# Patient Record
Sex: Male | Born: 1960 | Race: White | Hispanic: No | Marital: Married | Smoking: Never smoker
Health system: Southern US, Community
[De-identification: ages and names within clinical notes are randomized; demographics above are authoritative.]

## PROBLEM LIST (undated history)

## (undated) DIAGNOSIS — R519 Headache, unspecified: Secondary | ICD-10-CM

## (undated) DIAGNOSIS — M1990 Unspecified osteoarthritis, unspecified site: Secondary | ICD-10-CM

## (undated) DIAGNOSIS — T7840XA Allergy, unspecified, initial encounter: Secondary | ICD-10-CM

## (undated) DIAGNOSIS — F329 Major depressive disorder, single episode, unspecified: Secondary | ICD-10-CM

## (undated) DIAGNOSIS — Z9889 Other specified postprocedural states: Secondary | ICD-10-CM

## (undated) DIAGNOSIS — K219 Gastro-esophageal reflux disease without esophagitis: Secondary | ICD-10-CM

## (undated) DIAGNOSIS — F32A Depression, unspecified: Secondary | ICD-10-CM

## (undated) DIAGNOSIS — R51 Headache: Secondary | ICD-10-CM

## (undated) HISTORY — PX: SPINE SURGERY: SHX786

## (undated) HISTORY — DX: Major depressive disorder, single episode, unspecified: F32.9

## (undated) HISTORY — DX: Allergy, unspecified, initial encounter: T78.40XA

## (undated) HISTORY — DX: Depression, unspecified: F32.A

## (undated) HISTORY — DX: Unspecified osteoarthritis, unspecified site: M19.90

## (undated) HISTORY — DX: Gastro-esophageal reflux disease without esophagitis: K21.9

## (undated) HISTORY — DX: Headache: R51

## (undated) HISTORY — DX: Headache, unspecified: R51.9

## (undated) HISTORY — DX: Other specified postprocedural states: Z98.890

---

## 2000-06-26 HISTORY — PX: SHOULDER SURGERY: SHX246

## 2002-06-24 ENCOUNTER — Ambulatory Visit (HOSPITAL_COMMUNITY): Admission: RE | Admit: 2002-06-24 | Discharge: 2002-06-24 | Payer: Self-pay | Admitting: Family Medicine

## 2002-06-24 ENCOUNTER — Encounter: Payer: Self-pay | Admitting: Family Medicine

## 2002-07-16 ENCOUNTER — Encounter: Payer: Self-pay | Admitting: Neurosurgery

## 2002-07-18 ENCOUNTER — Inpatient Hospital Stay (HOSPITAL_COMMUNITY): Admission: RE | Admit: 2002-07-18 | Discharge: 2002-07-19 | Payer: Self-pay | Admitting: Neurosurgery

## 2002-07-18 ENCOUNTER — Encounter: Payer: Self-pay | Admitting: Neurosurgery

## 2003-06-27 HISTORY — PX: NECK SURGERY: SHX720

## 2004-06-26 HISTORY — PX: FOOT SURGERY: SHX648

## 2006-01-19 ENCOUNTER — Encounter (INDEPENDENT_AMBULATORY_CARE_PROVIDER_SITE_OTHER): Payer: Self-pay | Admitting: *Deleted

## 2006-01-19 ENCOUNTER — Ambulatory Visit (HOSPITAL_COMMUNITY): Admission: RE | Admit: 2006-01-19 | Discharge: 2006-01-20 | Payer: Self-pay | Admitting: Orthopedic Surgery

## 2006-06-26 HISTORY — PX: LUMBAR LAMINECTOMY: SHX95

## 2006-11-26 IMAGING — CR DG LUMBAR SPINE 1V
1 series · 1 of 1 positions shown · non-contrast
Comparison: none

CLINICAL DATA: L4-L5 discectomy

LUMBAR SPINE - PORTABLE ONE VIEW

[view not recorded]
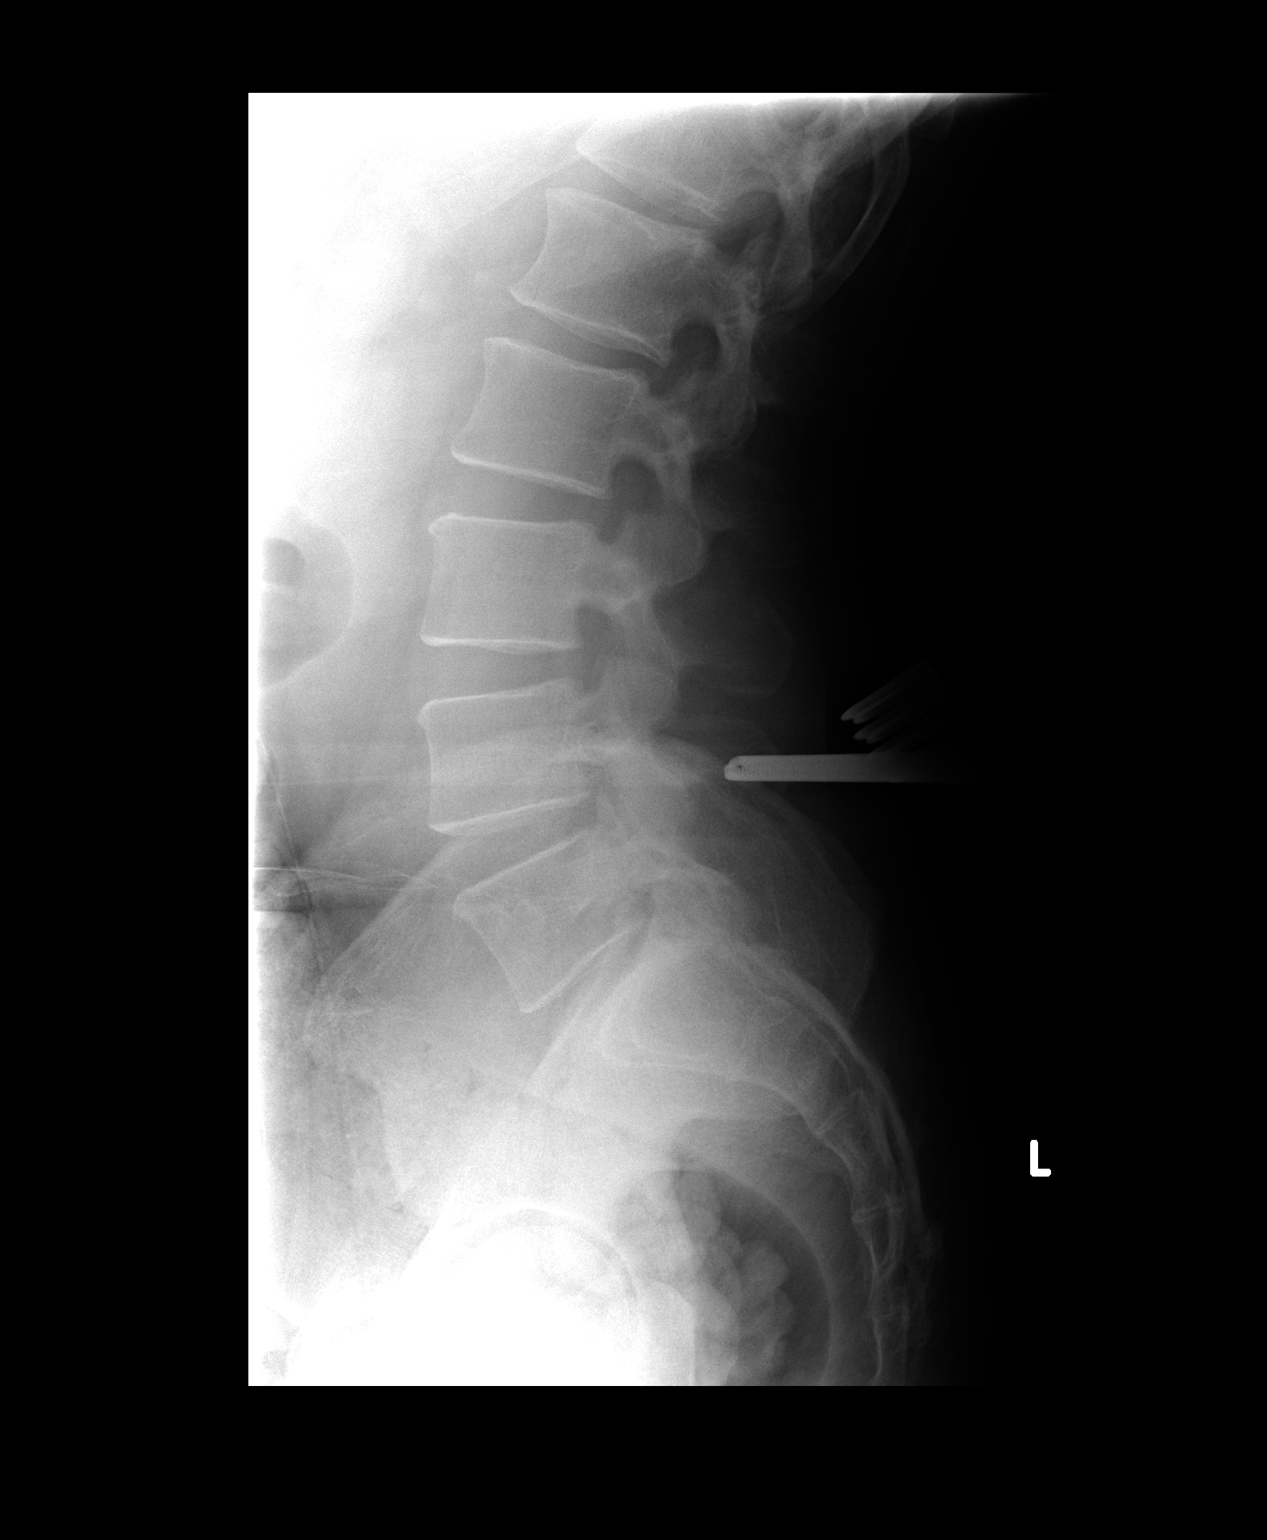

[1 of 1 positions shown; findings below may reference images not displayed]

FINDINGS: Single lateral portable view of the lumbar spine intraoperatively
shows posterior surgical instruments in place with a localizing instrument
directed the L4-L5 level.

IMPRESSION

Localization L4-L5.

## 2009-06-30 ENCOUNTER — Encounter: Payer: Self-pay | Admitting: Family Medicine

## 2009-07-01 ENCOUNTER — Ambulatory Visit: Payer: Self-pay | Admitting: Family Medicine

## 2009-07-01 DIAGNOSIS — M7710 Lateral epicondylitis, unspecified elbow: Secondary | ICD-10-CM | POA: Insufficient documentation

## 2009-07-01 DIAGNOSIS — M549 Dorsalgia, unspecified: Secondary | ICD-10-CM | POA: Insufficient documentation

## 2009-07-01 DIAGNOSIS — M5126 Other intervertebral disc displacement, lumbar region: Secondary | ICD-10-CM

## 2009-07-01 DIAGNOSIS — F341 Dysthymic disorder: Secondary | ICD-10-CM

## 2009-07-14 DIAGNOSIS — J45909 Unspecified asthma, uncomplicated: Secondary | ICD-10-CM | POA: Insufficient documentation

## 2009-07-14 DIAGNOSIS — K219 Gastro-esophageal reflux disease without esophagitis: Secondary | ICD-10-CM

## 2009-09-08 ENCOUNTER — Telehealth: Payer: Self-pay | Admitting: Family Medicine

## 2010-01-25 ENCOUNTER — Ambulatory Visit: Payer: Self-pay | Admitting: Family Medicine

## 2010-03-29 ENCOUNTER — Telehealth (INDEPENDENT_AMBULATORY_CARE_PROVIDER_SITE_OTHER): Payer: Self-pay | Admitting: *Deleted

## 2010-04-21 ENCOUNTER — Encounter: Payer: Self-pay | Admitting: Family Medicine

## 2010-05-05 ENCOUNTER — Encounter: Payer: Self-pay | Admitting: Family Medicine

## 2010-05-10 ENCOUNTER — Telehealth (INDEPENDENT_AMBULATORY_CARE_PROVIDER_SITE_OTHER): Payer: Self-pay | Admitting: *Deleted

## 2010-07-12 ENCOUNTER — Telehealth: Payer: Self-pay | Admitting: Family Medicine

## 2010-07-26 NOTE — Progress Notes (Signed)
Summary: Pt req script for Lexapro to [facility]  Phone Note Call from Patient Call back at [facility] Phone [phone]   Caller: Patient Summary of Call: Pt called and said that the Lexapro samples worked well. Pt has been out of samples for over a week and would like to get a script called in to [facility] [phone].  Initial call taken by: Lucy Antigua,  September 08, 2009 10:43 AM  Follow-up for Phone Call        ok will call in Follow-up by: Pura Spice, RN,  September 08, 2009 12:52 PM  Additional Follow-up for Phone Call Additional follow up Details #1::        I called pt and notified him that script is being called in to [facility] at [facility].  Additional Follow-up by: Lucy Antigua,  September 08, 2009 2:30 PM    New/Updated Medications: LEXAPRO 10 MG TABS (ESCITALOPRAM OXALATE) 1 each day for anxiety AND DEPRESSION Prescriptions: LEXAPRO 10 MG TABS (ESCITALOPRAM OXALATE) 1 each day for anxiety AND DEPRESSION  #30 x 11   Entered by:   Pura Spice, RN   Authorized by:   Judithann Sheen MD   Signed by:   Pura Spice, RN on 09/08/2009   Method used:   Faxed to ...       [facility] (retail)       [address]       [place], [place]  [address]       Ph: [phone]       Fax: [phone]   RxID:   [id number]

## 2010-07-26 NOTE — Progress Notes (Signed)
Summary: Disability FYI  Phone Note Outgoing Call   Summary of Call: Received paperwork from Disability Determination Services. Put paperwok on Dr Laurita Quint desk for him to fill out when he returns. Discussed with Dr Abner Greenspan. Initial call taken by: Josph Macho RMA,  May 10, 2010 12:06 PM

## 2010-07-26 NOTE — Letter (Signed)
Summary: [facility]  [facility]   Imported By: Georgian Co 04/21/2010 09:49:45  _____________________________________________________________________  External Attachment:    Type:   Image     Comment:   External Document

## 2010-07-26 NOTE — Assessment & Plan Note (Signed)
Summary: NEW PT EST // RS (PT UDRSTNDS DR HAS TO OK)   Vital Signs:  Patient profile:   50 year old male Weight:      200 pounds Temp:     98.6 degrees F BP sitting:   160 / 110  (left arm)  Vitals Entered By: Pura Spice, RN (July 01, 2009 2:55 PM) CC: to establish    History of Present Illness: this 50 year old white male who was a resident of [place] currently now moved to live in [place] desires to be a patient under my care first urge with him was 2 years ago when I referred him to an orthopedist for evaluation of back which resulted in a lumbar laminectomy for herniated disc. He improved initially but then began to have recurrence of his pain which is increased in severity over the past 6-9 months. He had surgery by [facility] and to give orthopedic group he His pain is present over the lumbar spine and radiating down the right leg to his foot. The patient has lost his insurance and were unable to do an MRI at this time and must treat him as with NSAIDs and analgesics. 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 And in fact terminally ill secondary to colon cancer  Allergies (verified): No Known Drug Allergies  Past History:  Past Medical History: lumbar laminectomy  Past Surgical History: lumbar laminectomy 2008  Review of Systems      See HPI General:  See HPI; Complains of malaise and sleep disorder; denies chills, fatigue, fever, loss of appetite, sweats, weakness, and weight loss. Eyes:  Denies blurring, discharge, double vision, eye irritation, eye pain, halos, itching, light sensitivity, red eye, vision  loss-1 eye, and vision loss-both eyes. ENT:  Denies decreased hearing, difficulty swallowing, ear discharge, earache, hoarseness, nasal congestion, nosebleeds, postnasal drainage, ringing in ears, sinus pressure, and sore throat. CV:  Denies bluish discoloration of lips or nails, chest pain or discomfort, difficulty breathing at night, difficulty breathing while lying down, fainting, fatigue, leg cramps with exertion, lightheadness, near fainting, palpitations, shortness of breath with exertion, swelling of feet, swelling of hands, and weight gain. Resp:  Complains of wheezing; long-term history of asthma has been under the care of Dr. Dan Europe on cervical. GI:  Denies abdominal pain, bloody stools, change in bowel habits, constipation, dark tarry stools, diarrhea, excessive appetite, gas, hemorrhoids, indigestion, loss of appetite, nausea, vomiting, vomiting blood, and yellowish skin color. GU:  Denies decreased libido, discharge, dysuria, erectile dysfunction, genital sores, hematuria, incontinence, nocturia, urinary frequency, and urinary hesitancy. MS:  See HPI; Complains of joint pain and low back pain. Derm:  See HPI; Denies changes in color of skin, changes in nail beds, dryness, excessive perspiration, flushing, hair loss, insect bite(s), itching, lesion(s), poor wound healing, and rash. Neuro:  Denies brief paralysis, difficulty with concentration, disturbances in coordination, falling down, headaches, inability to speak, memory loss, numbness, poor balance, seizures, sensation of room spinning, tingling, tremors, visual disturbances, and weakness. Psych:  Complains of depression.  Physical Exam  General:  Well-developed,well-nourished,in no  acute distress; alert,appropriate and cooperative throughout examination Head:  Normocephalic and atraumatic without obvious abnormalities. No apparent alopecia or balding. Eyes:  No corneal or conjunctival inflammation noted. EOMI. Perrla.  Funduscopic exam benign, without hemorrhages, exudates or papilledema. Vision grossly normal. Ears:  External ear exam shows no significant lesions or deformities.  Otoscopic examination reveals clear canals, tympanic membranes are intact bilaterally without bulging, retraction, inflammation or discharge. Hearing is grossly normal bilaterally. Nose:  External nasal examination shows no deformity or inflammation. Nasal mucosa are pink and moist without lesions or exudates. Mouth:  Oral mucosa and oropharynx without lesions or exudates.  Teeth in good repair. Neck:  No deformities, masses, or tenderness noted. Lungs:  Normal respiratory effort, chest expands symmetrically. Lungs are clear to auscultation, no crackles or wheezes. Heart:  Normal rate and regular rhythm. S1 and S2 normal without gallop, murmur, click, rub or other extra sounds. Abdomen:  opiates otherwise negative Rectal:  not examined Genitalia:  none exam Msk:  tenderness LS spine especially a right SI joint Limitation straight leg raising 60 on the right Tenderness over lateral epicondyle of the right elbow with some swelling and severe tenderness on touch Pulses:  R and L carotid,radial,femoral,dorsalis pedis and posterior tibial pulses are full and equal bilaterally Extremities:  No clubbing, cyanosis, edema, or deformity noted with normal full range of motion of all joints.     Impression & Recommendations:  Problem # 1:  ANXIETY DEPRESSION (ICD-300.4) Assessment New Lexapro 10 mg q. day  Problem # 2:  LATERAL EPICONDYLITIS, RIGHT (ICD-726.32) Assessment: New baclofen acts and 5 mg b.i.d. 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 1 q4h as  needed for pain, not over  4 per day, refillon schedule    Diclofenac Sodium 75 Mg Tbec (Diclofenac sodium) .Marland Kitchen... 1 two times a day for inflammation  Orders: Depo- Medrol 80mg  (J1040) Admin of Therapeutic Inj  intramuscular or subcutaneous ([address])  Problem # 5:  BACK PAIN, CHRONIC (ICD-724.5)  Complete Medication List: 1)  Symbicort 80-4.5 Mcg/act Aero (Budesonide-formoterol fumarate) 2)  Hydrocodone-acetaminophen 10-660 Mg Tabs (Hydrocodone-acetaminophen) .Marland Kitchen.. 1 q4h as needed for pain, not over  4 per day, refillon schedule 3)  Diclofenac Sodium 75 Mg Tbec (Diclofenac sodium) .Marland Kitchen.. 1 two times a day for inflammation 4)  Amitriptyline Hcl 25 Mg Tabs (Amitriptyline hcl) .... 2 hs for relief with pain and depression 5)  Lexapro 10 Mg Tabs (Escitalopram oxalate) .Marland Kitchen.. 1 each day for anxiety and depression  Patient Instructions: 1)  lEXAPRO 10 MG once daily FOR ANXIETY AND DEPRESSION 2)  amitryptilene 50 mg hs 1 02 2 25mg  at bedtime 3)  diclofenac 75 mg two times a day 4)  for inflammation 5)  hydrocodone  apa 1 qid as needed pain 6)  Depomedrol 160  mg Im 7)  call in 7-10 days or earlier if needed Prescriptions: AMITRIPTYLINE HCL 25 MG TABS (AMITRIPTYLINE HCL) 2 hs for relief with pain and depression  #100 x 5   Entered and Authorized by:   Judithann Sheen MD   Signed by:   Judithann Sheen MD on 07/01/2009   Method used:   Print then Give to Patient   RxID:   [id number] DICLOFENAC SODIUM 75 MG TBEC (DICLOFENAC SODIUM)  1 two times a day for inflammation  #60 x 11   Entered and Authorized by:   Judithann Sheen MD   Signed by:   Judithann Sheen MD on 07/01/2009   Method used:   Print then Give to Patient   RxID:   [id number] HYDROCODONE-ACETAMINOPHEN 10-660 MG TABS (HYDROCODONE-ACETAMINOPHEN) 1 q4h as needed for pain, not over  4 per day, refillon schedule  #120 x 5   Entered and Authorized by:   Judithann Sheen MD   Signed by:   Judithann Sheen  MD on 07/01/2009   Method used:   Print then Give to Patient   RxID:   [id number]    Medication Administration  Injection # 1:    Medication: Depo- Medrol 80mg     Diagnosis: BACK PAIN, CHRONIC (ICD-724.5)    Route: IM    Site: RUOQ gluteus    Exp Date: 03/2010    Lot #: [id number]    Mfr: teva    Comments: 160 mg given     Patient tolerated injection without complications    Given by: Pura Spice, RN (July 01, 2009 4:25 PM)  Orders Added: 1)  Depo- Medrol 80mg  [J1040] 2)  Admin of Therapeutic Inj  intramuscular or subcutaneous [96372] 3)  Est. Patient Level IV [28413]

## 2010-07-26 NOTE — Letter (Signed)
Summary: Telephone Triage Form Regarding New Patient Appt.  Telephone Triage Form Regarding New Patient Appt.   Imported By: Lanelle Bal 07/19/2009 08:30:30  _____________________________________________________________________  External Attachment:    Type:   Image     Comment:   External Document  Appended Document: Telephone Triage Form Regarding New Patient Appt. OK for new patient

## 2010-07-26 NOTE — Letter (Signed)
Summary: DISABILITY DETERMINATION  DISABILITY DETERMINATION   Imported By: Georgian Co 05/05/2010 08:10:22  _____________________________________________________________________  External Attachment:    Type:   Image     Comment:   External Document

## 2010-07-26 NOTE — Progress Notes (Signed)
Summary: Pt called [id number] info from dissability case sent to attorney  Phone Note Call from Patient Call back at Home Phone [phone]   Caller: Patient Summary of Call: Pt called and said that he needs to get info re: his dissability case, sent to his attorney. Pt said that Dr. Scotty Court has been handling this for the pt. Pls call asap to discuss.  Initial call taken by: Lucy Antigua,  March 29, 2010 9:16 AM  Follow-up for Phone Call        If he wants my input he has to come in Follow-up by: Danise Edge MD,  March 29, 2010 2:26 PM  Additional Follow-up for Phone Call Additional follow up Details #1::        Patient informed and states he will call back to schedule appt Additional Follow-up by: Josph Macho RMA,  March 29, 2010 2:40 PM

## 2010-07-26 NOTE — Assessment & Plan Note (Signed)
Summary: fup//ccm   Vital Signs:  Patient profile:   50 year old male Weight:      197 pounds O2 Sat:      98 % Temp:     98.6 degrees F Pulse rate:   96 / minute Pulse rhythm:   regular BP sitting:   140 / 100  (left arm) Cuff size:   large  Vitals Entered By: Pura Spice, RN (January 25, 2010 4:15 PM) CC: refills    History of Present Illness: This 50 year old white male with chronic back pain has had previous knee a UDS to move and continues to have pain. 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 He needs analgesics in order to function he is now in the process of applying for disability and is to see the disability doctor within the next 2 weeks hydrocodone have not been adequate to control his pain he does not have the money of financial support to go to a pain clinic so I'll attempt to help him with his pain at this time and later referred him to a pain clinic we have had her long talks and discussed the controlled substance contract However he still does have amitriptyline as well as give him oxycodone for 3 months or upon then he should have seen a physician and then Possibly with the help with his father and sister may see his neurosurgeon patient also has a problem with asthma, uses Azmacort and Advair in the past           Allergies (verified): No Known Drug Allergies  Review of Systems      See HPI  The patient denies anorexia, fever, weight loss, weight gain, vision loss, decreased hearing, hoarseness, chest pain, syncope, dyspnea on exertion, peripheral edema, prolonged cough, headaches, hemoptysis, abdominal pain, melena, hematochezia, severe indigestion/heartburn, hematuria, incontinence, genital sores, muscle weakness, suspicious skin lesions, transient blindness, difficulty walking, depression, unusual weight change, abnormal bleeding, enlarged lymph nodes, angioedema, breast masses, and testicular masses.    Physical  Exam  General:  Well-developed,well-nourished,in no acute distress; alert,appropriate and cooperative throughout examinationoverweight-appearing.   Lungs:  bronchial breathing with expiratory wheezes on deep inspiration no dullness to percussion no rales Heart:  Normal rate and regular rhythm. S1 and S2 normal without gallop, murmur, click, rub or other extra sounds. Msk:  limitation flexion of the lumbosacral spine as well as a bilateral lumbar muscle spasm Reflexes hyporeflexic Pulses:  R and L carotid,radial,femoral,dorsalis pedis and posterior tibial pulses are full and equal bilaterally Extremities:  No clubbing, cyanosis, edema, or deformity noted with normal full range of motion of all joints.     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 1 q4h as needed for pain, not over  4 per day, refillon schedule His updated medication list for this problem includes:    Diclofenac Sodium 75  Mg Tbec (Diclofenac sodium) .Marland Kitchen... 1 two times a day for inflammation    Oxycodone-acetaminophen 10-650 Mg Tabs (Oxycodone-acetaminophen) .Marland Kitchen... 1 ta b q4-6 h as needed pain, not over 4 per ayd    Oxycodone-acetaminophen 10-650 Mg Tabs (Oxycodone-acetaminophen) .Marland Kitchen... 1 q4h as needed pain not over 4 per day,  fill 0n 3 sept    Oxycodone-acetaminophen 10-650 Mg Tabs (Oxycodone-acetaminophen) .Marland Kitchen... 1 q4-6 h as needed pain, not over 4 per day, fill 3 oct  Orders: Depo- Medrol 80mg  (J1040) Depo- Medrol 80mg  (J1040) Admin of Therapeutic Inj  intramuscular or subcutaneous (60454)  Complete Medication List: 1)  Symbicort 80-4.5 Mcg/act Aero (Budesonide-formoterol fumarate) 2)  Diclofenac Sodium 75 Mg Tbec (Diclofenac sodium) .Marland Kitchen.. 1 two times a day for inflammation 3)  Amitriptyline Hcl 25 Mg Tabs (Amitriptyline hcl) .... 2 hs for relief with pain and depression 4)  Lexapro 10 Mg Tabs (Escitalopram oxalate) .Marland Kitchen.. 1 each day for anxiety and depression 5)  Oxycodone-acetaminophen 10-650 Mg Tabs (Oxycodone-acetaminophen) .Marland Kitchen.. 1 ta b q4-6 h as needed pain, not over 4 per ayd 6)  Oxycodone-acetaminophen 10-650 Mg Tabs (Oxycodone-acetaminophen) .Marland Kitchen.. 1 q4h as needed pain not over 4 per day, fill 0n 3 sept 7)  Oxycodone-acetaminophen 10-650 Mg Tabs (Oxycodone-acetaminophen) .Marland Kitchen.. 1 q4-6 h as needed pain, not over 4 per day, fill 3 oct  Patient Instructions: 1)  [facility] records to medical disability 2)  Take medications as prescribed 3)  Keep your Pulmo-Aide physician for the evaluation of your disability Prescriptions: OXYCODONE-ACETAMINOPHEN 10-650 MG TABS (OXYCODONE-ACETAMINOPHEN) 1 q4-6 h as needed pain, not over 4 per day, fill 3 Oct  #120 x 0   Entered and Authorized by:   Judithann Sheen MD   Signed by:   Judithann Sheen MD on 01/25/2010   Method used:   Print then Give to Patient   RxID:   [id number] OXYCODONE-ACETAMINOPHEN 10-650 MG TABS (OXYCODONE-ACETAMINOPHEN) 1 q4h as needed pain not over 4 per day, fill 0n 3 Sept  #120 x 0   Entered and Authorized by:   Judithann Sheen MD   Signed by:   Judithann Sheen MD on 01/25/2010   Method used:   Print then Give to Patient   RxID:   [id number] OXYCODONE-ACETAMINOPHEN 10-650 MG TABS (OXYCODONE-ACETAMINOPHEN) 1 ta b q4-6 h as  needed pain, not over 4 per ayd  #120 x 0   Entered and Authorized by:   Judithann Sheen MD   Signed by:   Judithann Sheen MD on 01/25/2010   Method used:   Print then Give to Patient   RxID:   [id number] AMITRIPTYLINE HCL 25 MG TABS (AMITRIPTYLINE HCL) 2 hs for relief with pain and depression  #100 x 4   Entered and Authorized by:   Judithann Sheen MD   Signed by:   Judithann Sheen MD on 01/25/2010   Method used:   Electronically to        [facility] (retail)       [address]       [place], [place]  [address]       Ph: [phone]       Fax: [phone]   RxID:   [id number] HYDROCODONE-ACETAMINOPHEN 10-660 MG TABS (HYDROCODONE-ACETAMINOPHEN) 1 q4h as needed for pain, not over  4 per day, refillon schedule  #120 x 5   Entered and Authorized by:   Judithann Sheen  MD   Signed by:   Judithann Sheen MD on 01/25/2010   Method used:   Print then Give to Patient   RxID:   [id number]    Medication Administration  Injection # 1:    Medication: Depo- Medrol 80mg     Diagnosis: BACK PAIN, CHRONIC (ICD-724.5)    Route: IM    Site: RUOQ gluteus    Exp Date: 09/2012    Lot #: [id number]    Mfr: Pharm[employer]    Patient tolerated injection without complications    Given by: Pura Spice, RN (January 25, 2010 5:14 PM)  Injection # 2:    Medication: Depo- Medrol 80mg     Diagnosis: BACK PAIN, CHRONIC (ICD-724.5)    Route: IM    Site: RUOQ gluteus    Exp Date: 09/2012    Lot #: [id number]    Mfr: [employer]    Patient tolerated injection without complications    Given by: Pura Spice, RN (January 25, 2010 5:15 PM)  Orders Added: 1)  Depo- Medrol 80mg  [J1040] 2)  Depo- Medrol 80mg  [J1040] 3)  Admin of Therapeutic Inj  intramuscular or subcutaneous [96372] 4)  Est. Patient Level III [14782]

## 2010-07-28 NOTE — Progress Notes (Signed)
Summary: Pt req refill of Hydrocodone and Amitriptyline  Phone Note Refill Request Call back at [phone] cell   Refills Requested: Medication #1:  Hydrocodone 10-650mg    Dosage confirmed as above?Dosage Confirmed  Medication #2:  AMITRIPTYLINE HCL 25 MG TABS 2 hs for relief with pain and depression   Dosage confirmed as above?Dosage Confirmed Pls call in to [facility] in [facility].   Pharmacy said that they could not fill meds until 08/10/10 and pt doesnt know why. Pls call in.    Method Requested: Telephone to [facility] in [facility] Initial call taken by: Lucy Antigua,  July 12, 2010 10:47 AM  Follow-up for Phone Call        ok to refill 1x early per Dr Scotty Court, l/m on pharmacy voicemail Follow-up by: Alfred Levins, CMA,  July 13, 2010 3:22 PM

## 2010-08-18 ENCOUNTER — Ambulatory Visit (INDEPENDENT_AMBULATORY_CARE_PROVIDER_SITE_OTHER): Payer: Self-pay | Admitting: Family Medicine

## 2010-08-18 ENCOUNTER — Encounter: Payer: Self-pay | Admitting: Family Medicine

## 2010-08-18 VITALS — BP 150/100 | HR 112 | Temp 98.9°F | Wt 206.0 lb

## 2010-08-18 DIAGNOSIS — M5020 Other cervical disc displacement, unspecified cervical region: Secondary | ICD-10-CM

## 2010-08-18 DIAGNOSIS — M549 Dorsalgia, unspecified: Secondary | ICD-10-CM

## 2010-08-18 DIAGNOSIS — G8929 Other chronic pain: Secondary | ICD-10-CM

## 2010-08-18 DIAGNOSIS — M1990 Unspecified osteoarthritis, unspecified site: Secondary | ICD-10-CM

## 2010-08-18 DIAGNOSIS — K219 Gastro-esophageal reflux disease without esophagitis: Secondary | ICD-10-CM

## 2010-08-18 DIAGNOSIS — M129 Arthropathy, unspecified: Secondary | ICD-10-CM

## 2010-08-18 MED ORDER — AMITRIPTYLINE HCL 100 MG PO TABS: 100.0000 mg | ORAL_TABLET | Freq: Every day | ORAL | Status: DC

## 2010-08-18 MED ORDER — OXYCODONE-ACETAMINOPHEN 10-650 MG PO TABS: 1.0000 | ORAL_TABLET | Freq: Four times a day (QID) | ORAL | Status: AC | PRN

## 2010-08-18 MED ORDER — OXYCODONE-ACETAMINOPHEN 10-650 MG PO TABS: 1.0000 | ORAL_TABLET | ORAL | Status: DC

## 2010-08-18 MED ORDER — OXYCODONE-ACETAMINOPHEN 10-650 MG PO TABS: 1.0000 | ORAL_TABLET | Freq: Four times a day (QID) | ORAL | Status: DC | PRN

## 2010-08-18 MED ORDER — DICLOFENAC SODIUM 75 MG PO TBEC: 75.0000 mg | DELAYED_RELEASE_TABLET | Freq: Two times a day (BID) | ORAL | Status: DC

## 2010-08-19 DIAGNOSIS — M5020 Other cervical disc displacement, unspecified cervical region: Secondary | ICD-10-CM | POA: Insufficient documentation

## 2010-08-19 NOTE — Progress Notes (Signed)
Subjective:    Patient ID: Shawn Nelson, male    DOB: [birth year], 50 y.o.   MRN: [id number] Chest 50 year old white male who has chronic back pain and has had same force some time her requiring him to actively do this him almost do part-time work because of the severity of the pain. He has multiple medical problems he has had surgery for herniated disc the lumbosacral spine and even after was never relieved completely with pain and has had 2 or lateral analgesics as hydrocodone at this point in time hydrocodone we will leave his pain if he is inactive 5 or if he is active working it gives him terribly. After working 4 hours a pain in his back is very severe. In addition to the pain in the lumbosacral spine he also has pain in her cervical spine which which is present and radiating into the left arm and hand he needs an MRI of the cervical spine but is unable to obtain this since he does not have any insurance.Marland Kitchen He is taking any NSAID , diclofenac and hydrocodone with Tylenol here it at night he wakes up with severe pain in his neck radiation into his arm We have discussed the fact that we'll try utilizing oxycodone for better relief Another problem he has varicose veins which are very obvious in the right leg and are tender and painful we discussed the fact that if he is fortunate enough to get his disability that we will be able to fully evaluate his problems and get adequate treatment there is obvious he is a very miserable uncomfortable person with pain in his quality of life due to this is not very good. His desire to work today or but isn't been able to work long enough hours is not possible due to the pain His problem with GERD is controlled with omeprazole HPI    Review of Systems  Constitutional: Positive for fever, activity change and fatigue. Negative for diaphoresis and appetite change.  HENT: Negative.   Eyes: Negative.   Respiratory: Positive for shortness of breath and wheezing.      Patient has problem with episodic wheezing or asthma and he goes to the [facility] Department for inhalation treatment  Cardiovascular: Negative.   Gastrointestinal:       History of GERD relieved with omeprazole  Genitourinary: Negative.   Musculoskeletal: Positive for back pain, joint swelling and arthralgias.  Skin: Negative.   Neurological: Negative.   Hematological: Negative.   Psychiatric/Behavioral:       Patient is very anxious and depressed over his multiple medical problems which cannot be resolved at this time       Objective:   Physical Exam HEENT no unremarkable findings thyroid level heart examination normal lungs minimal rhonchi bilaterally with wheezes on expiration abdomen obese otherwise negative examination of the back reveals marked tenderness over the right SI joint as well as examination of the neck there is tenderness over the cervical spine on the left side with radiation of pain Examination of the right leg revealed large varicosities on the lateral right thigh and lower leg,tender to palpation      Assessment & Plan:  Patient has very bad problem with chronic back pain as well as cervical pain which is very much aggravated with activity and work .only relieved with analgesicswith analgesics and I plan to change from hydrocodone to Percocet..when he is declared disabled and has Medicare will plan on radiological studies to seek full course of treatment  for his problem there to continue his regular medications as prescribed

## 2010-08-19 NOTE — Patient Instructions (Signed)
T2 change hydrocodone to oxycodone 10 650 q.4 h. P.r.n. Pain not over 4 for day Avoid as much as possible activities that aggravate both the back and neck Continue diclofenac, cold packs followed by heat treatment Call p.r.n.

## 2010-09-22 ENCOUNTER — Ambulatory Visit: Payer: Self-pay | Admitting: Family Medicine

## 2010-11-11 NOTE — Op Note (Signed)
NAMEASHBY, MOSKAL             ACCOUNT NO.:  [id number]   MEDICAL RECORD NO.:  [id number]          PATIENT TYPE:  AMB   LOCATION:  SDS                          FACILITY:  [facility]   PHYSICIAN:  Nelda Severe, MD      DATE OF BIRTH:  02/11/1961   DATE OF PROCEDURE:  01/19/2006  DATE OF DISCHARGE:                                 OPERATIVE REPORT   SURGEON:  Dr. Nelda Severe.   ASSISTANT:  OR staff.   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   POSTOPERATIVE DIAGNOSIS:  Left L4-5 herniated nucleus pulposus lumbar disc.   OPERATIVE PROCEDURE:  L4-5 left-sided laminotomy, medial facetectomy, disc  excision.   OPERATIVE NOTE:  The patient was placed under general endotracheal  anesthesia.  Vancomycin had been administered intravenously for antibiotic  prophylaxis.  He was positioned prone on the [place] frame.  The upper  extremities were padded from axilla to hands with foam.  Care was taken to  avoid hyperabduction and flexion of the shoulders and to avoid hyperflexion  of the elbows.  There was no pressure on the cubital tunnels.  The thighs,  knees and shins and feet were supported on pillows.   Hair was clipped from the lumbar area.  The proposed midline incision was  marked with a skin marker.  The skin was prepped with DuraPrep and draped in  square fashion.  The drapes were secured with Ioban.   A midline incision was made was at what was perceived to be the L4-5 level.  Paraspinal muscles were mobilized to the left side.  A cross-table lateral  radiograph was taken with a Kocher clamp on what proved to be the spinous  process of L4.  The paraspinal muscle was further mobilized to expose the L4-  5 interlaminar space.  A Taylor retractor was placed lateral to the L4-5  apophyseal  joint.  The interlaminar space was cleared of soft tissue.  A  high-speed bur was then used to perform a medial facetectomy and proximal  laminotomy into the  lamina of L4.  A 3.0 Carlen curet was then used to  separate ligamentum flavum and joint capsule from the undersurface of the  superior articular process of L5 and from the lamina of L5.  The laminotomy  was then extended laterally so that a medial facetectomy was completed and  the small portion of the proximal lamina of L5 removed.  Ligamentum flavum  was mobilized from its lateral attachment in the lateral recess.  It was  retracted medially.  Actually no ligamentum flavum was actually excised.   The L5 nerve root was visualized and mobilized medially.  A few epidural  veins were bipolar coagulated.  The disc was contained and appeared as the  MRI image suggested it would.  The outer fibers of the annulus were  perforated with a Agricultural engineer.  We then used a combination of  Epstein curets, Carlen curet and pituitary rongeurs to further enucleate the  disc.  The floor of the spinal canal was palpated with a ball-tip nerve hook  and  the L5 nerve root appeared well decompressed.  The ball-tipped nerve  hook was also used to palpate the L4-5 neural foramen and there was no  suggestion of any further disc impingement laterally upon the L4 nerve root.   The wound was thoroughly irrigated with saline.  The disc space was  irrigated with saline.   Earlier, while waiting for x-ray, we had injected the paraspinal muscles and  lumbar fascia with a mixture of 0.25% Marcaine with epinephrine and 1%  lidocaine with epinephrine.   The wound was then closed in layers using interrupted and continuous #1  Vicryl in the fascia, interrupted 2-0 Vicryl on the subcutaneous tissue and  3-0 undyed Vicryl placed in subcuticular continuous fashion for the skin.  Skin edges were reinforced with Steri-Strips.  A nonadherent antibiotic  ointment dressing was applied and secured with OpSite.   Blood loss estimated at 50 mL.  There were no intraoperative complications.  At the time of dictation, the  patient was not awakened sufficiently to  perform a neurologic examination.      Nelda Severe, MD  Electronically Signed     MT/MEDQ  D:  01/19/2006  T:  01/19/2006  Job:  [id number]

## 2010-11-11 NOTE — Discharge Summary (Signed)
Shawn Nelson, Shawn Nelson             ACCOUNT NO.:  [id number]   MEDICAL RECORD NO.:  [id number]          PATIENT TYPE:  OIB   LOCATION:  5004                         FACILITY:  [facility]   PHYSICIAN:  Nelda Severe, MD      DATE OF BIRTH:  [birth year]   DATE OF ADMISSION:  01/19/2006  DATE OF DISCHARGE:  01/20/2006                                 DISCHARGE SUMMARY   DATE OF ADMISSION:  01/19/2006   DATE OF DISCHARGE:  01/20/2006   HOSPITAL COURSE:  This 50 year old male was admitted for management of  chronic progressive left sciatic pain secondary to an L4-5 disc herniation.  On the day of admission he was taken to the operating room where a left L4-5  laminotomy, medial facetectomy and disc excision were carried out.  The L5  nerve root well decompressed at the termination of surgery.  The L4 nerve  root did not appear compressed in the neural foramina at L4-5.   Postoperatively his left leg pain was resolved.  He is complaining of back  pain this morning after the local anesthetic injected into the paraspinal  fascia had worn off.   At this time he is being discharged with instruction to avoid bending and  lifting, avoid continuous sitting for more than 20 to 30 minutes for the  next week to two weeks, to make and appointment to see me in the office in  10 to 12 days time to inform me if there is any persistent drainage or  fever.   He is being given a prescription for Flexeril 10 mg PO q6h prn for spasms  #25 tablets, which he may fill if his supply at home runs out.  He has also  been given a prescription for Norco 10 mg one to two q6h prn maximum 6 per  day # 60 tablets.   DIAGNOSIS:  L4-5 disc herniation left side.   CONDITION ON DISCHARGE:  Improved, ambulatory, voiding without difficulty.   Follow up arrangements and restrictions-see above.      Nelda Severe, MD  Electronically Signed     MT/MEDQ  D:  01/20/2006  T:  01/21/2006  Job:  [id number]

## 2010-11-11 NOTE — Op Note (Signed)
Shawn Nelson, Shawn Nelson                         ACCOUNT NO.:  [id number]   MEDICAL RECORD NO.:  [id number]                   PATIENT TYPE:  INP   LOCATION:  3008                                 FACILITY:  [facility]   PHYSICIAN:  Danae Orleans. Venetia Maxon, M.D.               DATE OF BIRTH:  1960/12/18   DATE OF PROCEDURE:  07/18/2002  DATE OF DISCHARGE:                                 OPERATIVE REPORT   PREOPERATIVE DIAGNOSIS:  Herniated disk, spondylosis, degenerative disk  disease, cervical radiculopathy, C5-6 and C6-7 levels.   POSTOPERATIVE DIAGNOSIS:  Herniated disk, spondylosis, degenerative disk  disease, cervical radiculopathy, C5-6 and C6-7 levels.   PROCEDURE:  Anterior cervical decompression and fusion, C5-6 and C6-7 levels  with allograft bone graft and anterior cervical plate.   SURGEON:  Danae Orleans. Venetia Maxon, M.D.   ASSISTANT:  Payton Doughty, M.D.   ANESTHESIA:  General endotracheal anesthesia.   ESTIMATED BLOOD LOSS:  500 mL.   COMPLICATIONS:  None.   DISPOSITION:  To recovery.   INDICATIONS:  The patient is a 50 year old man with asthma with a  significant left cervical radiculopathy at C5-6 and C6-7 levels.  It  is  elected to take him to surgery for anterior cervical diskectomy and fusion.   DESCRIPTION OF PROCEDURE:  The patient was brought to the operating room.  Following the satisfactory and uncomplicated induction of general  endotracheal anesthesia and placement of intravenous lines, the patient was  placed in the supine position on the operating table.  His neck was placed  in slight extension and he was placed in 10 pounds of Holter traction.  His  anterior neck was then prepped and draped in the usual sterile fashion.  The  area of planned incision was infiltrated with 0.25% Marcaine and 0.5%  lidocaine with 1:200,000 epinephrine.  The incision was made in the midline  and carried to the anterior border of the sternocleidomastoid muscle and  sharply through the  platysmal layer.  Subplatysmal dissection was then  performed, exposing the anterior cervical spine using blunt dissection and  keeping the carotid sheath lateral and trachea and esophagus medial.  A bent  spinal needle was placed to the C4-5 level because of the patient's large  body habitus, and this was visualized but no other level below this was  visualized.  The longus colli muscles were then taken down from the anterior  cervical spine from the C5 through C7 levels using electrocautery and the  Key elevator.  The self-retaining Shadow Line retractor was placed to  facilitate exposure.  The disk space at C5-6 and C6-7 levels were then  incised and removed in a piecemeal fashion.  These appeared to be markedly  spondylitic levels.  Ventral osteophytes were removed with Leksell rongeur.  Initially at C6-7 level the disk space was further cleared of residual disk  material and end plates were stripped with Angelique Blonder curettes and  then using  the high-speed drill and microscopic visualization and an A2 equivalent bur,  the end plates were drilled down and the uncinate processes were shaved down  as well.  The posterior longitudinal ligament was incised and removed in a  piecemeal fashion using a variety of 2 and 3 mm gold-tipped Kerrison  rongeurs.  The  C7 nerve roots were decompressed bilaterally.  There was  extensive venous plexus in the lateral aspect of the spinal canal, and this  was controlled using Gelfoam soaked in thrombin.  Both the C7 nerve roots  were felt to be well-decompressed.  Subsequently a 7 mm pre-fashioned  patellar allograft bone wedge was then sized with dummy sizers and were then  placed in the interspace and counter sunk appropriately.  A similar  decompression was performed at the C5-6 level and again the uncinate spurs,  particularly on the left, were drilled down and removed.  This resulted in  significant decompression of the cervical spinal cord and the C6  nerve  roots.  Again epidural veins were encountered in the neural foramina, which  were controlled with Gelfoam soaked in thrombin, and again the C6 nerve  roots were felt to be well-decompressed.  A similarly-sized bone graft was  placed and counter sunk appropriately.  A Trinica anterior cervical plate  was then affixed to the anterior cervical spine.  This was a 42 mm plate  using 14 mm variable-angled screws, two at C5, two at C6, and two at C7  levels.  All screws had excellent purchase.  Locking mechanisms were  engaged.  The wound was then irrigated with bacitracin and saline.  It was  elected not to get an x-ray in the operating room, as it was felt that none  of the surgical site would be visualized.  The soft tissues were inspected  and found to be in good repair with no evidence of any bleeding.  The  platysmal layer was then closed with 3-0 Vicryl sutures, the skin edges were  reapproximated with a running 4-0 Vicryl subcuticular stitch, and the wound  was dressed with Dermabond.  The patient was extubated in the operating room  and taken to the recovery room in stable and satisfactory condition, having  tolerated the procedure well.  All counts were correct at the end of the  case.                                               Danae Orleans. Venetia Maxon, M.D.    JDS/MEDQ  D:  07/18/2002  T:  07/18/2002  Job:  [id number]

## 2010-11-15 ENCOUNTER — Encounter: Payer: Self-pay | Admitting: Family Medicine

## 2010-11-15 ENCOUNTER — Ambulatory Visit: Payer: Self-pay | Admitting: Family Medicine

## 2010-11-15 VITALS — BP 138/90 | HR 100 | Temp 98.7°F | Wt 207.0 lb

## 2010-11-15 DIAGNOSIS — G500 Trigeminal neuralgia: Secondary | ICD-10-CM

## 2010-11-15 DIAGNOSIS — M545 Low back pain: Secondary | ICD-10-CM

## 2010-11-15 DIAGNOSIS — M79609 Pain in unspecified limb: Secondary | ICD-10-CM

## 2010-11-15 DIAGNOSIS — I83811 Varicose veins of right lower extremities with pain: Secondary | ICD-10-CM

## 2010-11-15 MED ORDER — OXYCODONE-ACETAMINOPHEN 10-650 MG PO TABS: 1.0000 | ORAL_TABLET | Freq: Four times a day (QID) | ORAL | Status: DC | PRN

## 2010-11-15 MED ORDER — CARBAMAZEPINE 200 MG PO TABS: 200.0000 mg | ORAL_TABLET | Freq: Three times a day (TID) | ORAL | Status: DC

## 2010-11-15 MED ORDER — OXYCODONE-ACETAMINOPHEN 10-650 MG PO TABS: ORAL_TABLET | ORAL | Status: DC

## 2010-11-15 NOTE — Patient Instructions (Signed)
The pain in scalp on left is trigeminal neuralgia, treated with tegretol 200mg  three times per day For rt leg pain, varicose veins , fget support stocking for this leg and war when working or standing Continue other medications Gave 3 oxycodone prescriptions for , 23June and 23 July Return in 3 months

## 2010-12-23 MED ORDER — OXYCODONE-ACETAMINOPHEN 10-650 MG PO TABS: 1.0000 | ORAL_TABLET | Freq: Four times a day (QID) | ORAL | Status: AC | PRN

## 2010-12-23 NOTE — Progress Notes (Signed)
  Subjective:    Patient ID: Shawn Nelson, male    DOB: [birth year], 50 y.o.   MRN: [id number]  HPI    Review of Systems     Objective:   Physical Exam        Assessment & Plan:

## 2011-02-07 ENCOUNTER — Ambulatory Visit (INDEPENDENT_AMBULATORY_CARE_PROVIDER_SITE_OTHER): Payer: Self-pay | Admitting: Family Medicine

## 2011-02-07 ENCOUNTER — Encounter: Payer: Self-pay | Admitting: Family Medicine

## 2011-02-07 VITALS — BP 130/90 | HR 102 | Temp 98.4°F | Wt 212.0 lb

## 2011-02-07 DIAGNOSIS — J45909 Unspecified asthma, uncomplicated: Secondary | ICD-10-CM

## 2011-02-07 DIAGNOSIS — F329 Major depressive disorder, single episode, unspecified: Secondary | ICD-10-CM

## 2011-02-07 DIAGNOSIS — G500 Trigeminal neuralgia: Secondary | ICD-10-CM

## 2011-02-07 DIAGNOSIS — M549 Dorsalgia, unspecified: Secondary | ICD-10-CM

## 2011-02-07 DIAGNOSIS — G8929 Other chronic pain: Secondary | ICD-10-CM

## 2011-02-07 MED ORDER — OXYCODONE HCL 20 MG PO TABS: 1.0000 | ORAL_TABLET | Freq: Three times a day (TID) | ORAL | Status: DC

## 2011-02-07 NOTE — Progress Notes (Signed)
  Subjective:    Patient ID: Shawn Nelson, male    DOB: 09-Dec-1960, 50 y.o.   MRN: [id number] This 50 year old white male is in today to renew his medications for pain, chronic back pain and other joint problems. He has applied for disability which I am surprised he has not received. He works peripheral days per week and considerable pain but is helped somewhat with the oxycodone which apparently needs to be increased He had to stop diclofenac due to the cost trigeminal neuralgia is controlled with Tegretol his asthma is controlled Symbicort which he gets from [place] a to help his insomnia he takes Elavil or amitriptyline at night HPI    Review of Systems see history of present illness    Objective:   Physical Exam Patient is a well-built well-nourished obese white male in no distress while sitting Lungs are clear heart no abnormalities noted Examination of the spine revealed tenderness over both SI joints and lower lumbar spine has tenderness in the right wrist which had been injured as well as his right ankle and foot and resulting arthritis       Assessment & Plan:  Chronic back pain will increase oxycodone to 20 mg 4 times a day Cymbalta 60 mg daily Ibuprofen 600 mg 4 times a day Continue amitriptyline at bedtime continue Prilosec for GERD

## 2011-02-07 NOTE — Patient Instructions (Signed)
Addition to the pain tablets take ibuprofen 200 mg tablets 3 tablets 4 times daily Since the disability has not come through the pain is increased in severity bowel prescribe oxycodone 20 mg 4 times daily in an attempt to give you some relief and allow you to work some continue to take amitriptyline 30 mg at bedtime and I have some Cymbalta samples that I will use take one per day which will help as far as depression and pain Discontinue the diclofenac Continue Tegretol for  trigeminal neuralgia as needed I hope and pray the disability will come through very soon because we are running out of what to do

## 2011-04-11 ENCOUNTER — Ambulatory Visit: Payer: Self-pay | Admitting: Family Medicine

## 2011-04-25 ENCOUNTER — Ambulatory Visit: Payer: Self-pay | Admitting: Family Medicine

## 2011-04-25 ENCOUNTER — Ambulatory Visit (INDEPENDENT_AMBULATORY_CARE_PROVIDER_SITE_OTHER): Payer: Self-pay | Admitting: Family Medicine

## 2011-04-25 ENCOUNTER — Encounter: Payer: Self-pay | Admitting: Family Medicine

## 2011-04-25 DIAGNOSIS — G8929 Other chronic pain: Secondary | ICD-10-CM

## 2011-04-25 DIAGNOSIS — M545 Low back pain, unspecified: Secondary | ICD-10-CM

## 2011-04-25 DIAGNOSIS — M542 Cervicalgia: Secondary | ICD-10-CM

## 2011-04-25 DIAGNOSIS — J45909 Unspecified asthma, uncomplicated: Secondary | ICD-10-CM

## 2011-04-25 MED ORDER — AMITRIPTYLINE HCL 100 MG PO TABS: 100.0000 mg | ORAL_TABLET | Freq: Every day | ORAL | Status: DC

## 2011-04-25 MED ORDER — ALBUTEROL SULFATE 4 MG PO TABS: 4.0000 mg | ORAL_TABLET | Freq: Two times a day (BID) | ORAL | Status: DC

## 2011-04-25 MED ORDER — OXYCODONE HCL 20 MG PO TABS: 1.0000 | ORAL_TABLET | Freq: Four times a day (QID) | ORAL | Status: DC

## 2011-04-25 MED ORDER — BUDESONIDE-FORMOTEROL FUMARATE 80-4.5 MCG/ACT IN AERO: 2.0000 | INHALATION_SPRAY | Freq: Two times a day (BID) | RESPIRATORY_TRACT | Status: DC

## 2011-04-25 NOTE — Progress Notes (Signed)
  Subjective:    Patient ID: Shawn Nelson, male    DOB: 1960/11/26, 51 y.o.   MRN: [id number]  HPI Here for an introductory visit to me and for med refills. He has suffered with chronic neck and low back pain for years, and now he is totally unable to work because of these pains. He applied for disability one and a half years ago, but he has still not been approved as yet. He is working with a Clinical research associate, and they are apparently getting close. He depends on his family for support, and they pay all his medical bills. His asthma has been stable.    Review of Systems  Constitutional: Negative.   Respiratory: Negative.   Cardiovascular: Negative.   Musculoskeletal: Positive for back pain.       Objective:   Physical Exam  Constitutional: He appears well-developed and well-nourished.  Neck: No thyromegaly present.  Cardiovascular: Normal rate, regular rhythm, normal heart sounds and intact distal pulses.   Pulmonary/Chest: Effort normal and breath sounds normal. No respiratory distress. He has no wheezes. He has no rales. He exhibits no tenderness.  Lymphadenopathy:    He has no cervical adenopathy.          Assessment & Plan:  He seems to be stable. Meds were refilled for the next 3 months.

## 2011-06-29 ENCOUNTER — Other Ambulatory Visit: Payer: Self-pay | Admitting: Family Medicine

## 2011-06-29 NOTE — Telephone Encounter (Signed)
Pt need new rx oxycodone 20mg  . Pt has appt with dr Jonny Ruiz on feb 12-13.

## 2011-07-03 NOTE — Telephone Encounter (Signed)
Pls advise.  

## 2011-07-04 MED ORDER — OXYCODONE HCL 20 MG PO TABS: 1.0000 | ORAL_TABLET | Freq: Four times a day (QID) | ORAL | Status: DC

## 2011-07-04 NOTE — Telephone Encounter (Signed)
Script ready for pick up and left voice message for pt. 

## 2011-07-04 NOTE — Telephone Encounter (Signed)
Done for one month only  

## 2011-08-05 ENCOUNTER — Encounter: Payer: Self-pay | Admitting: Internal Medicine

## 2011-08-05 DIAGNOSIS — Z0000 Encounter for general adult medical examination without abnormal findings: Secondary | ICD-10-CM | POA: Insufficient documentation

## 2011-08-08 ENCOUNTER — Ambulatory Visit (INDEPENDENT_AMBULATORY_CARE_PROVIDER_SITE_OTHER): Payer: Self-pay | Admitting: Internal Medicine

## 2011-08-08 ENCOUNTER — Encounter: Payer: Self-pay | Admitting: Internal Medicine

## 2011-08-08 VITALS — BP 152/102 | HR 107 | Temp 97.2°F | Ht 66.0 in | Wt 220.4 lb

## 2011-08-08 DIAGNOSIS — R7309 Other abnormal glucose: Secondary | ICD-10-CM

## 2011-08-08 DIAGNOSIS — I10 Essential (primary) hypertension: Secondary | ICD-10-CM | POA: Insufficient documentation

## 2011-08-08 DIAGNOSIS — M1990 Unspecified osteoarthritis, unspecified site: Secondary | ICD-10-CM

## 2011-08-08 DIAGNOSIS — E669 Obesity, unspecified: Secondary | ICD-10-CM

## 2011-08-08 DIAGNOSIS — R7302 Impaired glucose tolerance (oral): Secondary | ICD-10-CM | POA: Insufficient documentation

## 2011-08-08 DIAGNOSIS — J45909 Unspecified asthma, uncomplicated: Secondary | ICD-10-CM

## 2011-08-08 LAB — GLUCOSE, POCT (MANUAL RESULT ENTRY): POC Glucose: 122

## 2011-08-08 MED ORDER — CARBAMAZEPINE 200 MG PO TABS: 200.0000 mg | ORAL_TABLET | Freq: Three times a day (TID) | ORAL | Status: AC

## 2011-08-08 MED ORDER — OXYCODONE HCL 20 MG PO TABS: 1.0000 | ORAL_TABLET | Freq: Four times a day (QID) | ORAL | Status: DC

## 2011-08-08 MED ORDER — DICLOFENAC SODIUM 75 MG PO TBEC: 75.0000 mg | DELAYED_RELEASE_TABLET | Freq: Two times a day (BID) | ORAL | Status: AC

## 2011-08-08 MED ORDER — ALBUTEROL SULFATE HFA 108 (90 BASE) MCG/ACT IN AERS: 2.0000 | INHALATION_SPRAY | Freq: Four times a day (QID) | RESPIRATORY_TRACT | Status: DC | PRN

## 2011-08-08 MED ORDER — AMLODIPINE BESYLATE 5 MG PO TABS
5.0000 mg | ORAL_TABLET | Freq: Every day | ORAL | Status: DC
Start: 2011-08-08 — End: 2012-08-29

## 2011-08-08 MED ORDER — AMITRIPTYLINE HCL 100 MG PO TABS: 100.0000 mg | ORAL_TABLET | Freq: Every day | ORAL | Status: DC

## 2011-08-08 MED ORDER — BUDESONIDE-FORMOTEROL FUMARATE 80-4.5 MCG/ACT IN AERO: 2.0000 | INHALATION_SPRAY | Freq: Two times a day (BID) | RESPIRATORY_TRACT | Status: DC

## 2011-08-08 NOTE — Progress Notes (Signed)
Subjective:    Patient ID: Shawn Nelson, male    DOB: October 15, 1960, 51 y.o.   MRN: [id number]  HPI  Here as new pt to establish, former pt of Dr Scotty Court who has since retired,  Has been considered total disbabled per Dr Scotty Court due to polyarthritis/DJD, pt having been turned down so far for SSI, reamins unemployed since 2010, relying on family for support, no insurance today and very much wants to avoid expensive evalaution or tx today.  Due for court review of disability application October 15, 2011.  He is unaware that medicare eligibility does not occur for 2 yrs after date of disability. Needs mult meds refilled today.  Has ongoing obesity.  Has ongoing elevated BP as well, similar to today.   Pt denies polydipsia, polyuria, not trying to follow any particular diet,  wt overall increasing and little exercise.   Pt denies fever, wt loss, night sweats, loss of appetite, or other constitutional symptoms .  CBG in the office today 122, no hx of gluc intolerance Past Medical History  Diagnosis Date  . Hx of decompressive lumbar laminectomy   . Asthma   . GERD (gastroesophageal reflux disease)   . Arthritis   . Depression   . Generalized headaches   . Allergy    Past Surgical History  Procedure Date  . Lumbar laminectomy 2008  . Neck surgery 2005  . Foot surgery 2006  . Shoulder surgery 2002  . Spine surgery     reports that he has never smoked. He has never used smokeless tobacco. He reports that he drinks about 14.4 ounces of alcohol per week. He reports that he does not use illicit drugs. family history includes Breast cancer in an unspecified family member; Cancer in his mother; and Colon cancer in an unspecified family member. No Known Allergies Current Outpatient Prescriptions on File Prior to Visit  Medication Sig Dispense Refill  . ibuprofen (ADVIL,MOTRIN) 200 MG tablet Take 200 mg by mouth every 6 (six) hours as needed.        Marland Kitchen omeprazole (PRILOSEC) 20 MG capsule Take 20 mg by  mouth daily.        . Potassium (POTASSIMIN PO) Take 99 mg by mouth once a week.         Review of Systems Review of Systems  Constitutional: Negative for diaphoresis and unexpected weight change.  HENT: Negative for drooling and tinnitus.   Eyes: Negative for photophobia and visual disturbance.  Respiratory: Negative for choking and stridor.   Gastrointestinal: Negative for vomiting and blood in stool.  Genitourinary: Negative for hematuria and decreased urine volume.    Objective:   Physical Exam BP 152/102  Pulse 107  Temp(Src) 97.2 F (36.2 C) (Oral)  Ht 5\' 6"  (1.676 m)  Wt 220 lb 6 oz (99.961 kg)  BMI 35.57 kg/m2  SpO2 96% Physical Exam  VS noted, morbid obese Constitutional: Pt appears well-developed and well-nourished.  HENT: Head: Normocephalic.  Right Ear: External ear normal.  Left Ear: External ear normal.  Eyes: Conjunctivae and EOM are normal. Pupils are equal, round, and reactive to light.  Neck: Normal range of motion. Neck supple.  Cardiovascular: Normal rate and regular rhythm.   Pulmonary/Chest: Effort normal and breath sounds normal.  Abd:  Soft, NT, non-distended, + BS Neurological: Pt is alert. No cranial nerve deficit.  Skin: Skin is warm. No erythema.  Psychiatric: Pt behavior is normal. Thought content normal. 1+ nervous    Assessment & Plan:

## 2011-08-08 NOTE — Assessment & Plan Note (Addendum)
Fasting cbg 122 in the office today; pt declines a1c or other labs due to cost; no insurance today, plans to apply for the [facility] charity care program, will f/u next visit

## 2011-08-08 NOTE — Assessment & Plan Note (Signed)
After review of centricity record as well, pt has pattern of elev SBP at the time though related to pain or stress, but lately with sbp ave about 145/<100 per pt; to start amlodipine 5 qd, and ASA 81 mg per day

## 2011-08-08 NOTE — Assessment & Plan Note (Signed)
Urged to lose wt with less calories, more active if able, to check cbg today

## 2011-08-08 NOTE — Assessment & Plan Note (Signed)
stable overall by hx and exam, most recent data reviewed with pt, and pt to continue medical treatment as before, for med refills,  SpO2 Readings from Last 3 Encounters:  08/08/11 96%  04/25/11 98%  02/07/11 90%

## 2011-08-08 NOTE — Patient Instructions (Signed)
Your blood sugar was slightly high at 122 today Please call if you would like to have the Hgba1c (or "A1C" test) done to see if this means you have "full blown" diabetes Take all new medications as prescribed - the amlodipine 5 mg per day for blood pressure Continue all other medications as before You are given the hardcopies of all your medication today; please call when you need refills Please also start Aspirin 81 mg - 1 per day, but COATED only (OTC at the drug store) to reduce risk of stroke and heart attack You may want to check with the Financial Office at [facility] hospital for applying for the [facility] charity care program, or for Medicaid at the office near [facility] Please return in 6 months

## 2011-10-24 ENCOUNTER — Other Ambulatory Visit: Payer: Self-pay | Admitting: Internal Medicine

## 2011-10-24 MED ORDER — OXYCODONE HCL 20 MG PO TABS: 1.0000 | ORAL_TABLET | Freq: Four times a day (QID) | ORAL | Status: DC

## 2011-10-24 NOTE — Telephone Encounter (Signed)
Called the patient left message to call back 

## 2011-10-24 NOTE — Telephone Encounter (Signed)
Pt requesting 3 mths of oxycodone 20 mg--pt ph# [phone] requesting prescriptions mail to him

## 2011-10-24 NOTE — Telephone Encounter (Signed)
Ok for 3 mo, done hardcopy to robin  We would prefer pt pick up med and show ID if possible, unless this is a severe hardship for him

## 2011-10-25 NOTE — Telephone Encounter (Signed)
Called left message to call back 

## 2011-10-25 NOTE — Telephone Encounter (Signed)
The patient called back .  Informed prescriptions are ready for pickup at the front desk.  Informed we cannot mail the prescriptions.  The patient stated his father would pickup for him.

## 2011-11-27 ENCOUNTER — Ambulatory Visit (INDEPENDENT_AMBULATORY_CARE_PROVIDER_SITE_OTHER): Payer: Self-pay | Admitting: Internal Medicine

## 2011-11-27 ENCOUNTER — Encounter: Payer: Self-pay | Admitting: Internal Medicine

## 2011-11-27 VITALS — BP 140/92 | HR 118 | Temp 98.7°F | Ht 66.0 in | Wt 224.2 lb

## 2011-11-27 DIAGNOSIS — M5412 Radiculopathy, cervical region: Secondary | ICD-10-CM | POA: Insufficient documentation

## 2011-11-27 DIAGNOSIS — M5416 Radiculopathy, lumbar region: Secondary | ICD-10-CM | POA: Insufficient documentation

## 2011-11-27 DIAGNOSIS — IMO0002 Reserved for concepts with insufficient information to code with codable children: Secondary | ICD-10-CM

## 2011-11-27 DIAGNOSIS — I10 Essential (primary) hypertension: Secondary | ICD-10-CM

## 2011-11-27 NOTE — Assessment & Plan Note (Signed)
Has persistent pain/weakness RLE with any standing up or walking, but now with worsening LLE pain as well with walking; has what sounds like [employer] care - ok for MRI LS spine, and also try to refer then to Orthopedic at [facility]

## 2011-11-27 NOTE — Assessment & Plan Note (Signed)
With worsening LUE pain/numb (motor ok), for c-spine MRI, and refer ortho at [facility] as has lack of insurance

## 2011-11-27 NOTE — Patient Instructions (Addendum)
You will be contacted regarding the referral for: MRI for neck and lower back, and hopefully the orthopedic at [facility] Continue all other medications as before Please remember to take your results of the MRI with you to your orthopedic evaluation at [facility] Please return in 6 months, or sooner if needed

## 2011-11-29 ENCOUNTER — Telehealth: Payer: Self-pay | Admitting: Internal Medicine

## 2011-11-29 NOTE — Telephone Encounter (Signed)
Called the patient left message to call back 

## 2011-11-29 NOTE — Telephone Encounter (Signed)
Dr Jonny Ruiz called [facility] self pay clinic, they are not accepting new patients.

## 2011-11-29 NOTE — Telephone Encounter (Signed)
[name] to contact pt  See below note per mary  Please ask pt if I can refer to local ortho, but remember most require about $200 "up front" to be able to be seen  Please let me know

## 2011-11-30 NOTE — Telephone Encounter (Signed)
Called the patient left message to call back 

## 2011-12-01 NOTE — Telephone Encounter (Signed)
Called the patient informed the patients father to have the patient call back with his response.

## 2011-12-03 ENCOUNTER — Encounter: Payer: Self-pay | Admitting: Internal Medicine

## 2011-12-03 NOTE — Assessment & Plan Note (Signed)
stable overall by hx and exam, most recent data reviewed with pt, and pt to continue medical treatment as before BP Readings from Last 3 Encounters:  11/27/11 140/92  08/08/11 152/102  04/25/11 134/72

## 2011-12-03 NOTE — Progress Notes (Signed)
Subjective:    Patient ID: Shawn Nelson, male    DOB: Jan 09, 1961, 51 y.o.   MRN: [id number]  HPI  Here to f/u;  Has been able to secure [employer] which covers him financially for any [place] related health costs, requests MRI and surgical eval, Pt continues to have recurring LBP without change in severity but remains mod to severe requiring narcotic med, but no bowel or bladder change, fever, wt loss,  worsening LE pain/numbness/weakness, gait change or falls.  Pt denies fever, wt loss, night sweats, loss of appetite, or other constitutional symptoms Past Medical History  Diagnosis Date  . Hx of decompressive lumbar laminectomy   . Asthma   . GERD (gastroesophageal reflux disease)   . Arthritis   . Depression   . Generalized headaches   . Allergy    Past Surgical History  Procedure Date  . Lumbar laminectomy 2008  . Neck surgery 2005  . Foot surgery 2006  . Shoulder surgery 2002  . Spine surgery     reports that he has never smoked. He has never used smokeless tobacco. He reports that he drinks about 14.4 ounces of alcohol per week. He reports that he does not use illicit drugs. family history includes Breast cancer in an unspecified family member; Cancer in his mother; and Colon cancer in an unspecified family member. No Known Allergies Current Outpatient Prescriptions on File Prior to Visit  Medication Sig Dispense Refill  . albuterol (PROVENTIL HFA;VENTOLIN HFA) 108 (90 BASE) MCG/ACT inhaler Inhale 2 puffs into the lungs every 6 (six) hours as needed for wheezing.  1 Inhaler  11  . amitriptyline (ELAVIL) 100 MG tablet Take 1 tablet (100 mg total) by mouth at bedtime. To help relieve pain and depression  30 tablet  5  . amLODipine (NORVASC) 5 MG tablet Take 1 tablet (5 mg total) by mouth daily.  30 tablet  11  . budesonide-formoterol (SYMBICORT) 80-4.5 MCG/ACT inhaler Inhale 2 puffs into the lungs 2 (two) times daily.  1 Inhaler  5  . carbamazepine (TEGRETOL) 200 MG  tablet Take 1 tablet (200 mg total) by mouth 3 (three) times daily. For head pain decrese to 1 bid when pain gone  90 tablet  11  . diclofenac (VOLTAREN) 75 MG EC tablet Take 1 tablet (75 mg total) by mouth 2 (two) times daily. For inflammation  180 tablet  3  . ibuprofen (ADVIL,MOTRIN) 200 MG tablet Take 200 mg by mouth every 6 (six) hours as needed.        Marland Kitchen omeprazole (PRILOSEC) 20 MG capsule Take 20 mg by mouth daily.        . Oxycodone HCl 20 MG TABS Take 1 tablet (20 mg total) by mouth 4 (four) times daily. To fill January 05, 2012  120 each  0  . Potassium (POTASSIMIN PO) Take 99 mg by mouth once a week.         Review of Systems All otherwise neg per pt     Objective:   Physical Exam BP 140/92  Pulse 118  Temp(Src) 98.7 F (37.1 C) (Oral)  Ht 5\' 6"  (1.676 m)  Wt 224 lb 4 oz (101.719 kg)  BMI 36.19 kg/m2  SpO2 94% Physical Exam  VS noted Constitutional: Pt appears well-developed and well-nourished.  HENT: Head: Normocephalic.  Right Ear: External ear normal.  Left Ear: External ear normal.  Eyes: Conjunctivae and EOM are normal. Pupils are equal, round, and reactive to light.  Neck:  Normal range of motion. Neck supple.  Cardiovascular: Normal rate and regular rhythm.   Pulmonary/Chest: Effort normal and breath sounds normal.  Spine: mild tender Neurological: Pt is alert. No cranial nerve deficit. LUE decreased sens to LT, and motor RLE 4+/5   Skin: Skin is warm. No erythema. No rash Psychiatric: Pt behavior is normal. Thought content normal.     Assessment & Plan:

## 2011-12-04 ENCOUNTER — Ambulatory Visit: Payer: Self-pay | Admitting: Internal Medicine

## 2011-12-05 NOTE — Telephone Encounter (Signed)
Called the patient unable to leave message.

## 2011-12-06 NOTE — Telephone Encounter (Signed)
Called left message to call back 

## 2011-12-06 NOTE — Telephone Encounter (Signed)
The patient called back and does not want a local referral at this time.  He just this am was approved for his disability and will followup with Dr. Jonny Ruiz once disability is in place.

## 2012-01-18 ENCOUNTER — Other Ambulatory Visit: Payer: Self-pay

## 2012-01-18 MED ORDER — OXYCODONE HCL 20 MG PO TABS: 1.0000 | ORAL_TABLET | Freq: Four times a day (QID) | ORAL | Status: DC

## 2012-01-18 NOTE — Telephone Encounter (Signed)
Done hardcopy to robin  

## 2012-01-18 NOTE — Telephone Encounter (Signed)
The patient would like a 3 month refill on oxycodone. Call back [phone] when ready to pickup.

## 2012-01-19 NOTE — Telephone Encounter (Signed)
Called the patient left detailed message that prescriptions requested are ready for pickup at the front desk. 

## 2012-01-22 ENCOUNTER — Other Ambulatory Visit: Payer: Self-pay | Admitting: Internal Medicine

## 2012-01-22 MED ORDER — BUDESONIDE-FORMOTEROL FUMARATE 80-4.5 MCG/ACT IN AERO: 2.0000 | INHALATION_SPRAY | Freq: Two times a day (BID) | RESPIRATORY_TRACT | Status: DC

## 2012-01-22 NOTE — Telephone Encounter (Signed)
rx done for pt assist application

## 2012-03-05 ENCOUNTER — Other Ambulatory Visit: Payer: Self-pay

## 2012-03-05 MED ORDER — AMITRIPTYLINE HCL 100 MG PO TABS: 100.0000 mg | ORAL_TABLET | Freq: Every day | ORAL | Status: DC

## 2012-03-05 NOTE — Telephone Encounter (Signed)
Faxed hardcopy to pharmacy. 

## 2012-03-05 NOTE — Telephone Encounter (Signed)
Done hardcopy to robin  

## 2012-04-29 ENCOUNTER — Encounter: Payer: Self-pay | Admitting: Internal Medicine

## 2012-04-29 ENCOUNTER — Other Ambulatory Visit (INDEPENDENT_AMBULATORY_CARE_PROVIDER_SITE_OTHER): Payer: Self-pay

## 2012-04-29 ENCOUNTER — Ambulatory Visit (INDEPENDENT_AMBULATORY_CARE_PROVIDER_SITE_OTHER): Payer: Self-pay | Admitting: Internal Medicine

## 2012-04-29 VITALS — BP 140/90 | HR 102 | Temp 98.8°F | Ht 66.0 in | Wt 229.2 lb

## 2012-04-29 DIAGNOSIS — R7302 Impaired glucose tolerance (oral): Secondary | ICD-10-CM

## 2012-04-29 DIAGNOSIS — R7309 Other abnormal glucose: Secondary | ICD-10-CM

## 2012-04-29 DIAGNOSIS — J45909 Unspecified asthma, uncomplicated: Secondary | ICD-10-CM

## 2012-04-29 DIAGNOSIS — I10 Essential (primary) hypertension: Secondary | ICD-10-CM

## 2012-04-29 DIAGNOSIS — M549 Dorsalgia, unspecified: Secondary | ICD-10-CM

## 2012-04-29 LAB — HEMOGLOBIN A1C: Hgb A1c MFr Bld: 5.5 % (ref 4.6–6.5)

## 2012-04-29 MED ORDER — OXYCODONE HCL 20 MG PO TABS: 1.0000 | ORAL_TABLET | Freq: Four times a day (QID) | ORAL | Status: DC

## 2012-04-29 MED ORDER — BUDESONIDE-FORMOTEROL FUMARATE 80-4.5 MCG/ACT IN AERO: 2.0000 | INHALATION_SPRAY | Freq: Two times a day (BID) | RESPIRATORY_TRACT | Status: DC

## 2012-04-29 NOTE — Assessment & Plan Note (Signed)
stable overall by hx and exam, and pt to continue medical treatment as before For labs today

## 2012-04-29 NOTE — Assessment & Plan Note (Signed)
stable overall by hx and exam, and pt to continue medical treatment as before, for med refills 

## 2012-04-29 NOTE — Progress Notes (Signed)
Subjective:    Patient ID: Shawn Nelson, male    DOB: Mar 09, 1961, 51 y.o.   MRN: [id number]  HPI  Here to f/u; overall doing ok,  Pt denies chest pain, increased sob or doe, wheezing, orthopnea, PND, increased LE swelling, palpitations, dizziness or syncope.  Pt denies new neurological symptoms such as new headache, or facial or extremity weakness or numbness   Pt denies polydipsia, polyuria Pt states overall good compliance with meds, trying to follow lower cholesterol, diabetic diet, wt overall stable but little exercise however.  Pt continues to have recurring LBP without change in severity, bowel or bladder change, fever, wt loss,  worsening LE pain/numbness/weakness, gait change or falls.  Needs med refills.  Out of symbicort as well, needs sample if able due to cost Past Medical History  Diagnosis Date  . Hx of decompressive lumbar laminectomy   . Asthma   . GERD (gastroesophageal reflux disease)   . Arthritis   . Depression   . Generalized headaches   . Allergy    Past Surgical History  Procedure Date  . Lumbar laminectomy 2008  . Neck surgery 2005  . Foot surgery 2006  . Shoulder surgery 2002  . Spine surgery     reports that he has never smoked. He has never used smokeless tobacco. He reports that he drinks about 14.4 ounces of alcohol per week. He reports that he does not use illicit drugs. family history includes Breast cancer in an unspecified family member; Cancer in his mother; and Colon cancer in an unspecified family member. No Known Allergies Current Outpatient Prescriptions on File Prior to Visit  Medication Sig Dispense Refill  . albuterol (PROVENTIL HFA;VENTOLIN HFA) 108 (90 BASE) MCG/ACT inhaler Inhale 2 puffs into the lungs every 6 (six) hours as needed for wheezing.  1 Inhaler  11  . amitriptyline (ELAVIL) 100 MG tablet Take 1 tablet (100 mg total) by mouth at bedtime. To help relieve pain and depression  30 tablet  5  . amLODipine (NORVASC) 5 MG tablet Take 1  tablet (5 mg total) by mouth daily.  30 tablet  11  . carbamazepine (TEGRETOL) 200 MG tablet Take 1 tablet (200 mg total) by mouth 3 (three) times daily. For head pain decrese to 1 bid when pain gone  90 tablet  11  . diclofenac (VOLTAREN) 75 MG EC tablet Take 1 tablet (75 mg total) by mouth 2 (two) times daily. For inflammation  180 tablet  3  . ibuprofen (ADVIL,MOTRIN) 200 MG tablet Take 200 mg by mouth every 6 (six) hours as needed.        Marland Kitchen omeprazole (PRILOSEC) 20 MG capsule Take 20 mg by mouth daily.        . Potassium (POTASSIMIN PO) Take 99 mg by mouth once a week.        . [DISCONTINUED] budesonide-formoterol (SYMBICORT) 80-4.5 MCG/ACT inhaler Inhale 2 puffs into the lungs 2 (two) times daily.  3 Inhaler  3   Review of Systems All otherwise neg per pt     Objective:   Physical Exam BP 140/90  Pulse 102  Temp 98.8 F (37.1 C) (Oral)  Ht 5\' 6"  (1.676 m)  Wt 229 lb 4 oz (103.987 kg)  BMI 37.00 kg/m2  SpO2 97% Physical Exam  VS noted Constitutional: Pt appears well-developed and well-nourished.  HENT: Head: Normocephalic.  Right Ear: External ear normal.  Left Ear: External ear normal.  Eyes: Conjunctivae and EOM are normal. Pupils are equal, round,  and reactive to light.  Neck: Normal range of motion. Neck supple.  Cardiovascular: Normal rate and regular rhythm.   Pulmonary/Chest: Effort normal and breath sounds normal.  Neurological: Pt is alert. Not confused  Skin: Skin is warm. No erythema.  Psychiatric: Pt behavior is normal. Thought content normal. 1+ nervous    Assessment & Plan:

## 2012-04-29 NOTE — Patient Instructions (Addendum)
Please return if you change your mind about the flu shot You are given the pain medication refills today (total of 3 months) You are given the samples of the Advair to use 1 puff twice per day Continue all other medications as before Please have the pharmacy call with any refills you may need. Please go to LAB in the Basement for the blood and/or urine tests to be done today You will be contacted by phone if any changes need to be made immediately.  Otherwise, you will receive a letter about your results with an explanation. Please remember to sign up for My Chart at your earliest convenience, as this will be important to you in the future with finding out test results. Please return in 6 months, or sooner if needed

## 2012-04-29 NOTE — Assessment & Plan Note (Signed)
Did not have symbicort sample today - gavie 1 mo advair 500/ for bid use, Continue all other medications as before

## 2012-04-29 NOTE — Assessment & Plan Note (Signed)
stable overall by hx and exam, most recent data reviewed with pt, and pt to continue medical treatment as before BP Readings from Last 3 Encounters:  04/29/12 140/90  11/27/11 140/92  08/08/11 152/102

## 2012-07-23 ENCOUNTER — Telehealth: Payer: Self-pay | Admitting: Internal Medicine

## 2012-07-23 MED ORDER — BUDESONIDE-FORMOTEROL FUMARATE 80-4.5 MCG/ACT IN AERO: 2.0000 | INHALATION_SPRAY | Freq: Two times a day (BID) | RESPIRATORY_TRACT | Status: AC

## 2012-07-23 MED ORDER — OXYCODONE HCL 20 MG PO TABS: 1.0000 | ORAL_TABLET | Freq: Four times a day (QID) | ORAL | Status: DC

## 2012-07-23 NOTE — Telephone Encounter (Signed)
Pt is requesting a refill on Oxycodone.  He also needs an RX to mail for Symbicort.

## 2012-07-23 NOTE — Telephone Encounter (Signed)
Done hardcopy to robin  

## 2012-07-23 NOTE — Telephone Encounter (Signed)
Called the patient informed to pickup hardcopy at the front desk. 

## 2012-08-01 ENCOUNTER — Other Ambulatory Visit: Payer: Self-pay | Admitting: Internal Medicine

## 2012-08-27 ENCOUNTER — Other Ambulatory Visit: Payer: Self-pay

## 2012-08-27 MED ORDER — OXYCODONE HCL 20 MG PO TABS: 1.0000 | ORAL_TABLET | Freq: Four times a day (QID) | ORAL | Status: DC

## 2012-08-27 NOTE — Telephone Encounter (Signed)
Done hardcopy to robin  

## 2012-08-28 NOTE — Telephone Encounter (Signed)
Called the patient left detailed message that prescription requested hardcopy is ready for pickup at the front desk.

## 2012-08-29 ENCOUNTER — Other Ambulatory Visit: Payer: Self-pay | Admitting: Internal Medicine

## 2012-08-29 NOTE — Telephone Encounter (Signed)
Done erx 

## 2012-09-23 ENCOUNTER — Other Ambulatory Visit: Payer: Self-pay

## 2012-09-23 MED ORDER — OXYCODONE HCL 20 MG PO TABS: 1.0000 | ORAL_TABLET | Freq: Four times a day (QID) | ORAL | Status: DC

## 2012-09-23 NOTE — Telephone Encounter (Signed)
Done hardcopy to robin  

## 2012-09-24 NOTE — Telephone Encounter (Signed)
Called the patient informed to pickup hardcopy at the front desk. 

## 2012-10-29 ENCOUNTER — Ambulatory Visit (INDEPENDENT_AMBULATORY_CARE_PROVIDER_SITE_OTHER): Payer: Self-pay | Admitting: Internal Medicine

## 2012-10-29 ENCOUNTER — Encounter: Payer: Self-pay | Admitting: Internal Medicine

## 2012-10-29 VITALS — BP 160/100 | HR 109 | Temp 98.7°F | Ht 67.0 in | Wt 230.4 lb

## 2012-10-29 DIAGNOSIS — I10 Essential (primary) hypertension: Secondary | ICD-10-CM

## 2012-10-29 DIAGNOSIS — R7309 Other abnormal glucose: Secondary | ICD-10-CM

## 2012-10-29 DIAGNOSIS — J45909 Unspecified asthma, uncomplicated: Secondary | ICD-10-CM

## 2012-10-29 DIAGNOSIS — R7302 Impaired glucose tolerance (oral): Secondary | ICD-10-CM

## 2012-10-29 DIAGNOSIS — M549 Dorsalgia, unspecified: Secondary | ICD-10-CM

## 2012-10-29 MED ORDER — OXYCODONE HCL 20 MG PO TABS: 1.0000 | ORAL_TABLET | Freq: Four times a day (QID) | ORAL | Status: DC

## 2012-10-29 NOTE — Patient Instructions (Signed)
Please continue all other medications as before, and refills have been done if requested. - the oxycodone Please continue all other medications as before, and refills have been done if requested. Please continue to monitor your Blood Pressure as you do once per month to make sure it stays < 140/90 Please have the pharmacy call with any other refills you may need. You can use the Mychart messaging to let me know about the oxycodone refills Thank you for enrolling in MyChart. Please follow the instructions below to securely access your online medical record. MyChart allows you to send messages to your doctor, view your test results, renew your prescriptions, schedule appointments, and more. To Log into My Chart online, please go by [employer] or [employer] to [email or web address], or download the MyChart App from the [employer] .  Your Username is: [email or web address] (pass ) Please return in 9 months, or sooner if needed, with Lab testing done 3-5 days before

## 2012-11-03 NOTE — Assessment & Plan Note (Signed)
stable overall by history and exam, recent data reviewed with pt, and pt to continue medical treatment as before,  to f/u any worsening symptoms or concerns SpO2 Readings from Last 3 Encounters:  10/29/12 95%  04/29/12 97%  11/27/11 94%

## 2012-11-03 NOTE — Progress Notes (Signed)
Subjective:    Patient ID: Shawn Nelson, male    DOB: 09-Mar-1961, 52 y.o.   MRN: [id number]  HPI  Here to f/u; overall doing ok,  Pt denies chest pain, increased sob or doe, wheezing, orthopnea, PND, increased LE swelling, palpitations, dizziness or syncope.  Pt denies polydipsia, polyuria, or low sugar symptoms such as weakness or confusion improved with po intake.  Pt denies new neurological symptoms such as new headache, or facial or extremity weakness or numbness.   Pt states overall good compliance with meds, has been trying to follow lower cholesterol, diabetic diet, with wt overall stable,  but little exercise however.  Pt continues to have recurring LBP without change in severity, bowel or bladder change, fever, wt loss,  worsening LE pain/numbness/weakness, gait change or falls, needs med refill. BP at home < 140/90 Past Medical History  Diagnosis Date  . Hx of decompressive lumbar laminectomy   . Asthma   . GERD (gastroesophageal reflux disease)   . Arthritis   . Depression   . Generalized headaches   . Allergy    Past Surgical History  Procedure Laterality Date  . Lumbar laminectomy  2008  . Neck surgery  2005  . Foot surgery  2006  . Shoulder surgery  2002  . Spine surgery      reports that he has never smoked. He has never used smokeless tobacco. He reports that he drinks about 14.4 ounces of alcohol per week. He reports that he does not use illicit drugs. family history includes Breast cancer in an unspecified family member; Cancer in his mother; and Colon cancer in an unspecified family member. No Known Allergies  Current Outpatient Prescriptions on File Prior to Visit  Medication Sig Dispense Refill  . amitriptyline (ELAVIL) 100 MG tablet Take 1 tablet by mouth every night at bedtime TO HELP RELIEVE PAIN AND DEPRESSION  90 tablet  1  . amLODipine (NORVASC) 5 MG tablet Take 1 tablet by mouth every day  30 tablet  8  . budesonide-formoterol (SYMBICORT) 80-4.5 MCG/ACT  inhaler Inhale 2 puffs into the lungs 2 (two) times daily.  3 Inhaler  3  . diclofenac (VOLTAREN) 75 MG EC tablet Take 1 tablet (75 mg total) by mouth 2 (two) times daily. For inflammation  180 tablet  3  . ibuprofen (ADVIL,MOTRIN) 200 MG tablet Take 200 mg by mouth every 6 (six) hours as needed.        Marland Kitchen omeprazole (PRILOSEC) 20 MG capsule Take 20 mg by mouth daily.        . Potassium (POTASSIMIN PO) Take 99 mg by mouth once a week.        Marland Kitchen PROAIR HFA 108 (90 BASE) MCG/ACT inhaler INHALE 2 PUFFS BY MOUTH EVERY 6 HOURS AS NEEDED FOR WHEEZE  8.5 g  4  . carbamazepine (TEGRETOL) 200 MG tablet Take 1 tablet (200 mg total) by mouth 3 (three) times daily. For head pain decrese to 1 bid when pain gone  90 tablet  11   No current facility-administered medications on file prior to visit.   Review of Systems  Constitutional: Negative for unexpected weight change, or unusual diaphoresis  HENT: Negative for tinnitus.   Eyes: Negative for photophobia and visual disturbance.  Respiratory: Negative for choking and stridor.   Gastrointestinal: Negative for vomiting and blood in stool.  Genitourinary: Negative for hematuria and decreased urine volume.  Musculoskeletal: Negative for acute joint swelling Skin: Negative for color change and wound.  Neurological:  Negative for tremors and numbness other than noted  Psychiatric/Behavioral: Negative for decreased concentration or  hyperactivity.       Objective:   Physical Exam BP 160/100  Pulse 109  Temp(Src) 98.7 F (37.1 C) (Oral)  Ht 5\' 7"  (1.702 m)  Wt 230 lb 6 oz (104.497 kg)  BMI 36.07 kg/m2  SpO2 95% VS noted, not ill appearing Constitutional: Pt appears well-developed and well-nourished.  HENT: Head: NCAT.  Right Ear: External ear normal.  Left Ear: External ear normal.  Eyes: Conjunctivae and EOM are normal. Pupils are equal, round, and reactive to light.  Neck: Normal range of motion. Neck supple.  Cardiovascular: Normal rate and regular  rhythm.   Pulmonary/Chest: Effort normal and breath sounds normal.  Abd:  Soft, NT, non-distended, + BS Lower lumbar spine diffuse mild tender Neurological: Pt is alert. Not confused , motor intact Skin: Skin is warm. No erythema.  Psychiatric: Pt behavior is normal. Thought content normal.     Assessment & Plan:

## 2012-11-03 NOTE — Assessment & Plan Note (Signed)
stable overall by history and exam,  and pt to continue medical treatment as before,  to f/u any worsening symptoms or concerns, for med refill 

## 2012-11-03 NOTE — Assessment & Plan Note (Signed)
stable overall by history and exam, recent data reviewed with pt, and pt to continue medical treatment as before,  to f/u any worsening symptoms or concerns BP Readings from Last 3 Encounters:  10/29/12 160/100  04/29/12 140/90  11/27/11 140/92

## 2012-11-03 NOTE — Assessment & Plan Note (Signed)
stable overall by history and exam, recent data reviewed with pt, and pt to continue medical treatment as before,  to f/u any worsening symptoms or concerns Lab Results  Component Value Date   HGBA1C 5.5 04/29/2012

## 2013-01-23 ENCOUNTER — Encounter: Payer: Self-pay | Admitting: Internal Medicine

## 2013-01-23 ENCOUNTER — Telehealth: Payer: Self-pay | Admitting: Internal Medicine

## 2013-01-23 MED ORDER — OXYCODONE HCL 20 MG PO TABS: 1.0000 | ORAL_TABLET | Freq: Four times a day (QID) | ORAL | Status: DC

## 2013-01-23 NOTE — Telephone Encounter (Signed)
Patient informed by email and phone call (left msg.) that rx is ready for pickup at the front desk.

## 2013-01-23 NOTE — Telephone Encounter (Signed)
Done hardcopy to robin  

## 2013-02-04 ENCOUNTER — Other Ambulatory Visit: Payer: Self-pay | Admitting: Internal Medicine

## 2013-02-18 MED ORDER — OXYCODONE HCL 20 MG PO TABS: 1.0000 | ORAL_TABLET | Freq: Four times a day (QID) | ORAL | Status: DC

## 2013-02-18 NOTE — Telephone Encounter (Signed)
Done hardcopy to robin  

## 2013-02-18 NOTE — Addendum Note (Signed)
Addended by: Corwin Levins on: 02/18/2013 01:38 PM   Modules accepted: Orders

## 2013-02-18 NOTE — Telephone Encounter (Signed)
Called the patient left detailed msg. Prescriptions are ready for pickup at the front desk.

## 2013-02-25 ENCOUNTER — Other Ambulatory Visit: Payer: Self-pay | Admitting: Internal Medicine

## 2013-02-25 NOTE — Telephone Encounter (Signed)
Done erx 

## 2013-04-11 ENCOUNTER — Encounter: Payer: Self-pay | Admitting: Internal Medicine

## 2013-04-15 NOTE — Telephone Encounter (Signed)
[name] to see above 

## 2013-05-01 ENCOUNTER — Other Ambulatory Visit: Payer: Self-pay

## 2013-05-01 ENCOUNTER — Ambulatory Visit (INDEPENDENT_AMBULATORY_CARE_PROVIDER_SITE_OTHER): Payer: Self-pay

## 2013-05-01 DIAGNOSIS — Z23 Encounter for immunization: Secondary | ICD-10-CM

## 2013-05-16 ENCOUNTER — Encounter: Payer: Self-pay | Admitting: Internal Medicine

## 2013-05-16 DIAGNOSIS — G894 Chronic pain syndrome: Secondary | ICD-10-CM

## 2013-05-16 MED ORDER — OXYCODONE HCL 20 MG PO TABS: 1.0000 | ORAL_TABLET | Freq: Four times a day (QID) | ORAL | Status: DC

## 2013-05-16 NOTE — Telephone Encounter (Signed)
Done hardcopy to Shawn Nelson, but also to let pt know:  You are given the letter today explaining the transitional pain medication refill policy due to recent change [place] law and [place] [employer] board regulations  Please be aware that I will no longer be able to offer monthly refills of any Schedule II or higher medication starting Jul 27, 2013

## 2013-05-27 ENCOUNTER — Other Ambulatory Visit: Payer: Self-pay | Admitting: Internal Medicine

## 2013-06-30 ENCOUNTER — Other Ambulatory Visit: Payer: Self-pay | Admitting: Internal Medicine

## 2013-07-28 ENCOUNTER — Ambulatory Visit: Payer: Self-pay | Admitting: Internal Medicine

## 2013-07-29 ENCOUNTER — Ambulatory Visit: Payer: Self-pay | Admitting: Internal Medicine

## 2013-07-30 ENCOUNTER — Ambulatory Visit (INDEPENDENT_AMBULATORY_CARE_PROVIDER_SITE_OTHER): Payer: Self-pay | Admitting: Internal Medicine

## 2013-07-30 ENCOUNTER — Encounter: Payer: Self-pay | Admitting: Internal Medicine

## 2013-07-30 VITALS — BP 150/90 | HR 111 | Temp 98.6°F | Ht 66.0 in | Wt 237.0 lb

## 2013-07-30 DIAGNOSIS — M549 Dorsalgia, unspecified: Secondary | ICD-10-CM

## 2013-07-30 MED ORDER — OXYCODONE HCL 20 MG PO TABS: 1.0000 | ORAL_TABLET | Freq: Four times a day (QID) | ORAL | Status: DC

## 2013-07-30 MED ORDER — OXYCODONE HCL 20 MG PO TABS: 1.0000 | ORAL_TABLET | Freq: Four times a day (QID) | ORAL | Status: AC

## 2013-07-30 MED ORDER — AMITRIPTYLINE HCL 100 MG PO TABS: ORAL_TABLET | ORAL | Status: AC

## 2013-07-30 NOTE — Patient Instructions (Addendum)
Please continue all other medications as before, and refills have been done if requested  - the pain medications, and the amitryptilene  Please have the pharmacy call with any other refills you may need, except please be aware:  You are given the letter today explaining the transitional pain medication refill policy, due to recent change in [place] law and [place] [facility] Regulations  Please be aware that I will no longer be able to offer monthly refills of any Schedule II or higher medication starting after today  Please return in 6 months, or sooner if needed, with Lab testing done 3-5 days before

## 2013-07-30 NOTE — Progress Notes (Signed)
Pre-visit discussion using our clinic review tool. No additional management support is needed unless otherwise documented below in the visit note.  

## 2013-07-30 NOTE — Progress Notes (Signed)
Subjective:    Patient ID: Shawn Nelson, male    DOB: [birth year], 53 y.o.   MRN: [id number]  HPI  Here today for f/u, will not have medicare insurance active until April 1, so only wants pain management reviewed today; Pt denies chest pain, increased sob or doe, wheezing, orthopnea, PND, increased LE swelling, palpitations, dizziness or syncope.  Pt denies new neurological symptoms such as new headache, or facial or extremity weakness or numbness   Pt denies polydipsia, polyuria.  Chronic pain relatively well controlled with left neck and arm pain essentially no change, and has not appeared to have abused or diverted his meds. No acute complaints.  Only drinks ETOH occas on the weekends Past Medical History  Diagnosis Date  . Hx of decompressive lumbar laminectomy   . Asthma   . GERD (gastroesophageal reflux disease)   . Arthritis   . Depression   . Generalized headaches   . Allergy    Past Surgical History  Procedure Laterality Date  . Lumbar laminectomy  2008  . Neck surgery  2005  . Foot surgery  2006  . Shoulder surgery  2002  . Spine surgery      reports that he has never smoked. He has never used smokeless tobacco. He reports that he drinks about 14.4 ounces of alcohol per week. He reports that he does not use illicit drugs. family history includes Breast cancer in an other family member; Cancer in his mother; Colon cancer in an other family member. No Known Allergies Current Outpatient Prescriptions on File Prior to Visit  Medication Sig Dispense Refill  . amLODipine (NORVASC) 5 MG tablet Take 1 tablet by mouth every day  30 tablet  7  . budesonide-formoterol (SYMBICORT) 80-4.5 MCG/ACT inhaler Inhale 2 puffs into the lungs 2 (two) times daily.  3 Inhaler  3  . diclofenac (VOLTAREN) 75 MG EC tablet Take 1 tablet (75 mg total) by mouth 2 (two) times daily. For inflammation  180 tablet  3  . ibuprofen (ADVIL,MOTRIN) 200 MG tablet Take 200 mg by mouth every 6 (six) hours as  needed.        Marland Kitchen omeprazole (PRILOSEC) 20 MG capsule Take 20 mg by mouth daily.        . Potassium (POTASSIMIN PO) Take 99 mg by mouth once a week.        Marland Kitchen PROAIR HFA 108 (90 BASE) MCG/ACT inhaler INHALE 2 PUFFS BY MOUTH EVERY 6 HOURS AS NEEDED FOR WHEEZE  8.5 g  2  . carbamazepine (TEGRETOL) 200 MG tablet Take 1 tablet (200 mg total) by mouth 3 (three) times daily. For head pain decrese to 1 bid when pain gone  90 tablet  11   No current facility-administered medications on file prior to visit.   Review of Systems All otherwise neg per pt     Objective:   Physical Exam BP 150/90  Pulse 111  Temp(Src) 98.6 F (37 C) (Oral)  Ht 5\' 6"  (1.676 m)  Wt 237 lb (107.502 kg)  BMI 38.27 kg/m2  SpO2 97% VS noted,  Constitutional: Pt appears well-developed and well-nourished.  HENT: Head: NCAT.  Right Ear: External ear normal.  Left Ear: External ear normal.  Eyes: Conjunctivae and EOM are normal. Pupils are equal, round, and reactive to light.  Neck: Normal range of motion. Neck supple.  Cardiovascular: Normal rate and regular rhythm.   Pulmonary/Chest: Effort normal and breath sounds normal.  Neurological: Pt is alert. Not confused ,  has generalized deconditioning but no frank motor weakness to UE or LE's Skin: Skin is warm. No erythema.  Spine nontender Psychiatric: Pt behavior is normal. Thought content normal. mild nervous    Assessment & Plan:

## 2013-07-30 NOTE — Assessment & Plan Note (Signed)
stable overall by history and exam, and pt to continue medical treatment as before,  to f/u any worsening symptoms or concerns, declines further attempt to refer to pain clinic for now due to lack of insurance; gave 3 mo rx pain med and reviewed with him my policy of not being able to do chronic pain management after that

## 2013-08-26 ENCOUNTER — Encounter: Payer: Self-pay | Admitting: Internal Medicine

## 2013-08-27 ENCOUNTER — Other Ambulatory Visit: Payer: Self-pay | Admitting: Internal Medicine

## 2013-11-25 ENCOUNTER — Other Ambulatory Visit: Payer: Self-pay | Admitting: Internal Medicine

## 2013-12-24 ENCOUNTER — Other Ambulatory Visit: Payer: Self-pay | Admitting: Internal Medicine

## 2014-02-25 ENCOUNTER — Other Ambulatory Visit: Payer: Self-pay

## 2014-02-25 MED ORDER — AMLODIPINE BESYLATE 5 MG PO TABS: 5.0000 mg | ORAL_TABLET | Freq: Every day | ORAL | Status: DC

## 2014-10-26 ENCOUNTER — Other Ambulatory Visit: Payer: Self-pay | Admitting: Internal Medicine

## 2014-11-25 DEATH — deceased
